# Patient Record
Sex: Female | Born: 1950 | Race: White | Hispanic: No | Marital: Married
Health system: Southern US, Community
[De-identification: ages and names within clinical notes are randomized; demographics above are authoritative.]

---

## 2006-12-01 ENCOUNTER — Encounter: Admission: RE | Admit: 2006-12-01 | Discharge: 2006-12-01 | Payer: Self-pay | Admitting: Unknown Physician Specialty

## 2007-03-29 ENCOUNTER — Encounter: Admission: RE | Admit: 2007-03-29 | Discharge: 2007-03-29 | Payer: Self-pay | Admitting: Unknown Physician Specialty

## 2007-05-04 ENCOUNTER — Encounter: Admission: RE | Admit: 2007-05-04 | Discharge: 2007-05-04 | Payer: Self-pay | Admitting: Interventional Radiology

## 2007-05-11 ENCOUNTER — Encounter: Admission: RE | Admit: 2007-05-11 | Discharge: 2007-05-11 | Payer: Self-pay | Admitting: Interventional Radiology

## 2007-05-30 ENCOUNTER — Encounter: Admission: RE | Admit: 2007-05-30 | Discharge: 2007-05-30 | Payer: Self-pay | Admitting: Interventional Radiology

## 2007-06-06 ENCOUNTER — Encounter: Admission: RE | Admit: 2007-06-06 | Discharge: 2007-06-06 | Payer: Self-pay | Admitting: Interventional Radiology

## 2007-06-28 ENCOUNTER — Encounter: Admission: RE | Admit: 2007-06-28 | Discharge: 2007-06-28 | Payer: Self-pay | Admitting: Interventional Radiology

## 2007-07-19 ENCOUNTER — Encounter: Admission: RE | Admit: 2007-07-19 | Discharge: 2007-07-19 | Payer: Self-pay | Admitting: Interventional Radiology

## 2007-11-14 ENCOUNTER — Encounter: Admission: RE | Admit: 2007-11-14 | Discharge: 2007-11-14 | Payer: Self-pay | Admitting: Interventional Radiology

## 2008-08-16 IMAGING — US EM EST PATIENT OFFICE LEVEL 3 (15 MIN)
1 series · 14 of 16 positions shown · non-contrast
Comparison: none

[Series 1: em est patient office level 3 (15 min) · 14 of 71 slices shown]
[im 1/71]
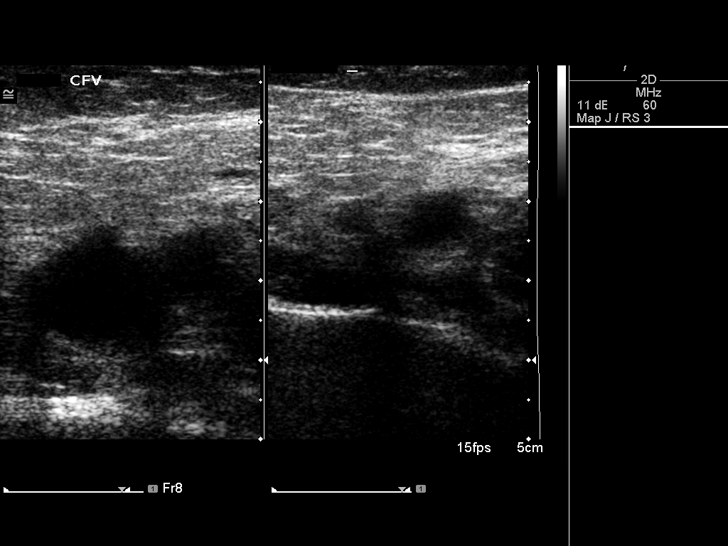
[im 5/71]
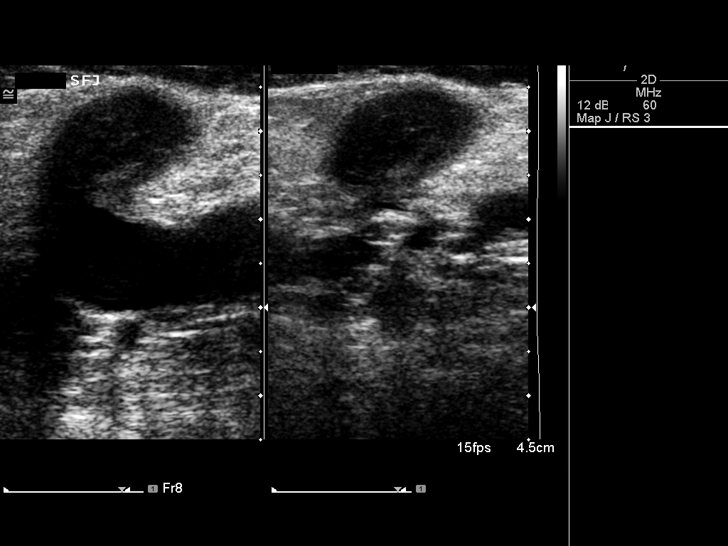
[im 10/71]
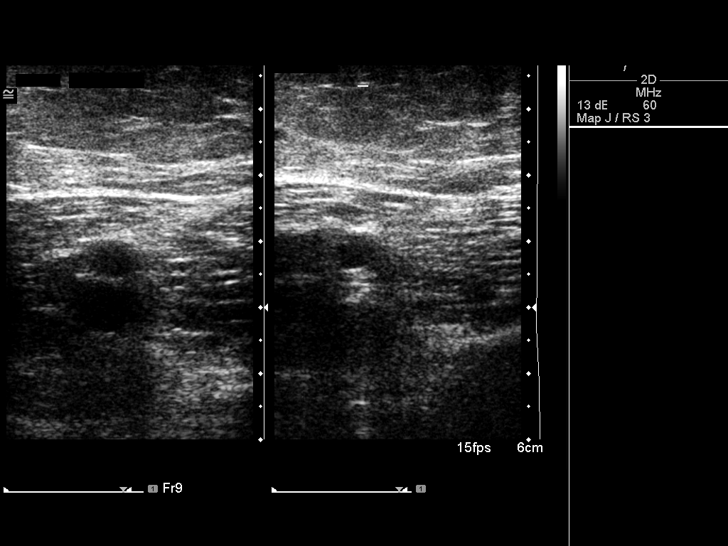
[im 19/71]
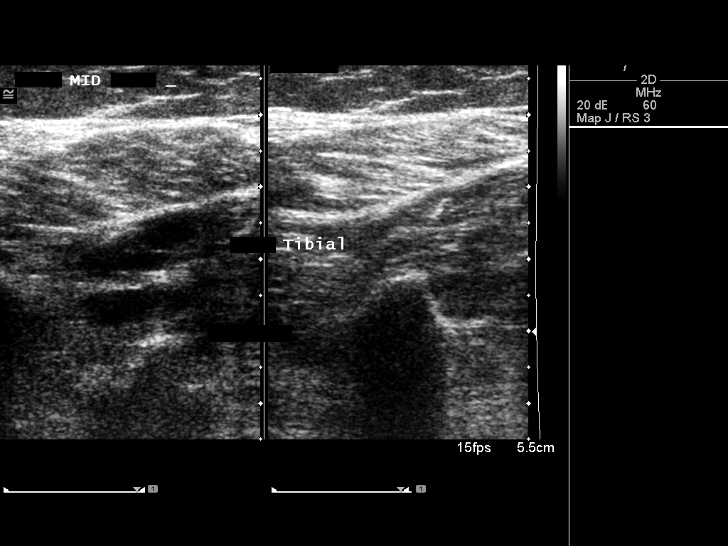
[im 24/71]
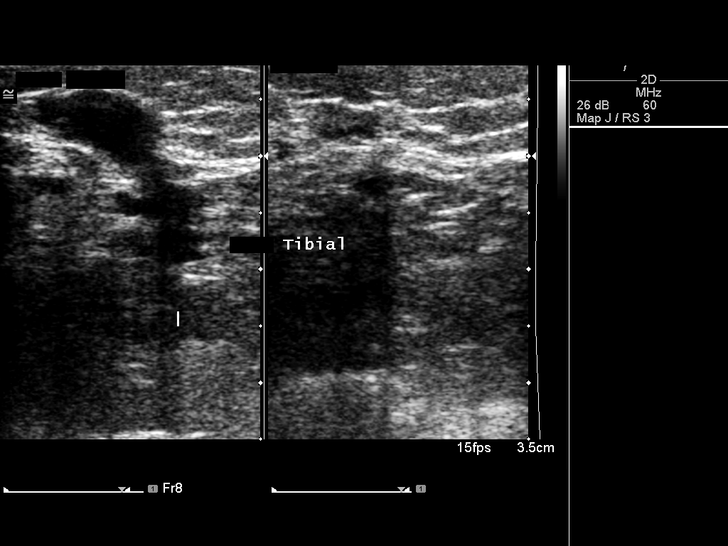
[im 29/71]
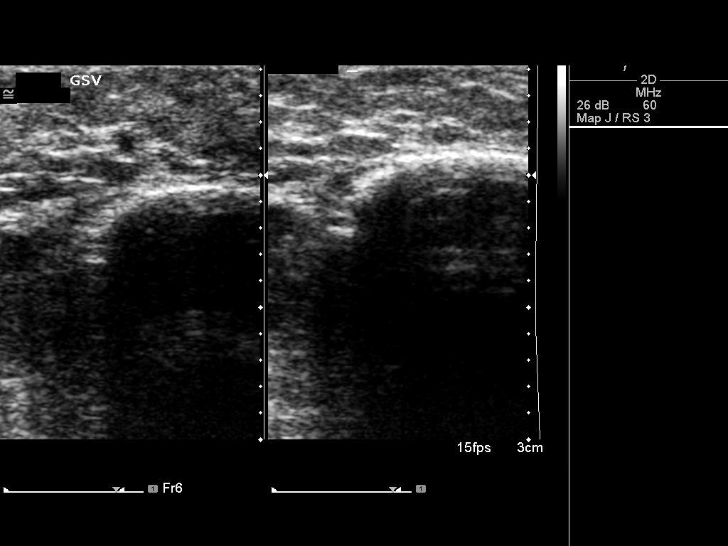
[im 33/71]
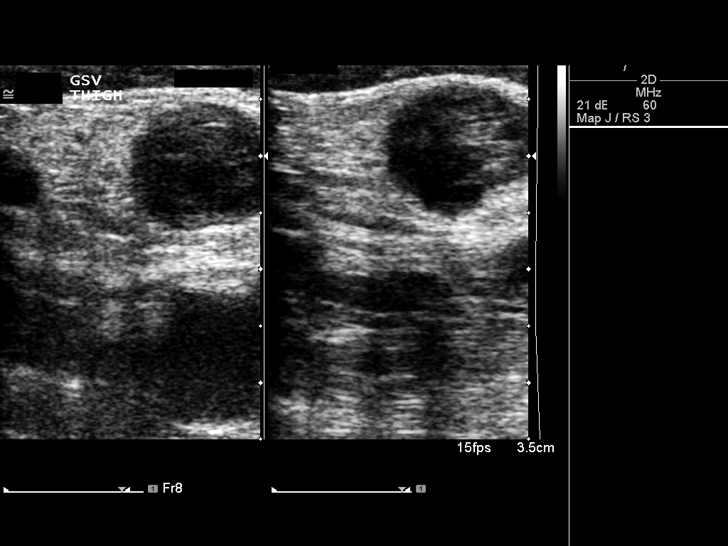
[im 38/71]
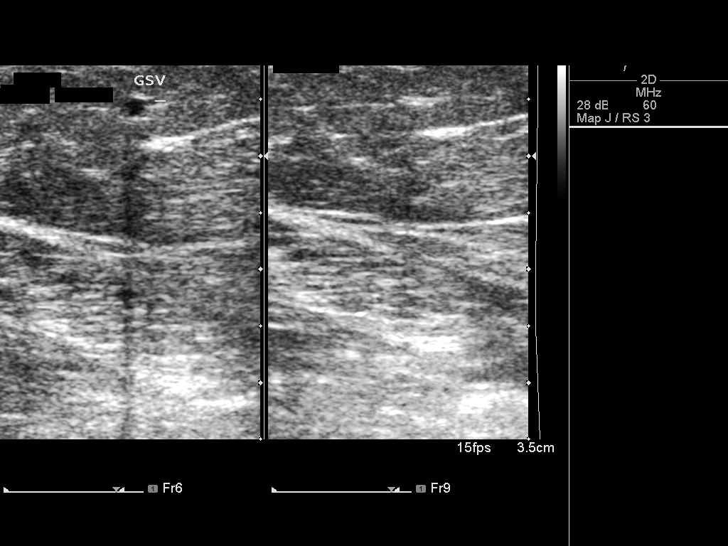
[im 43/71]
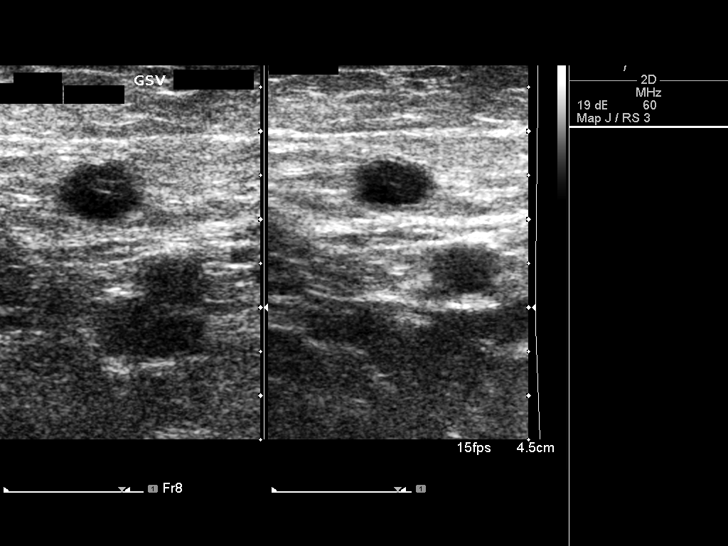
[im 47/71]
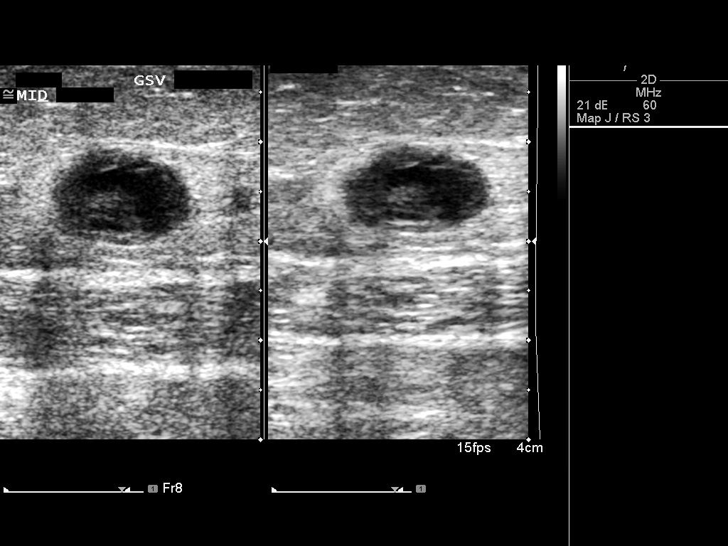
[im 57/71]
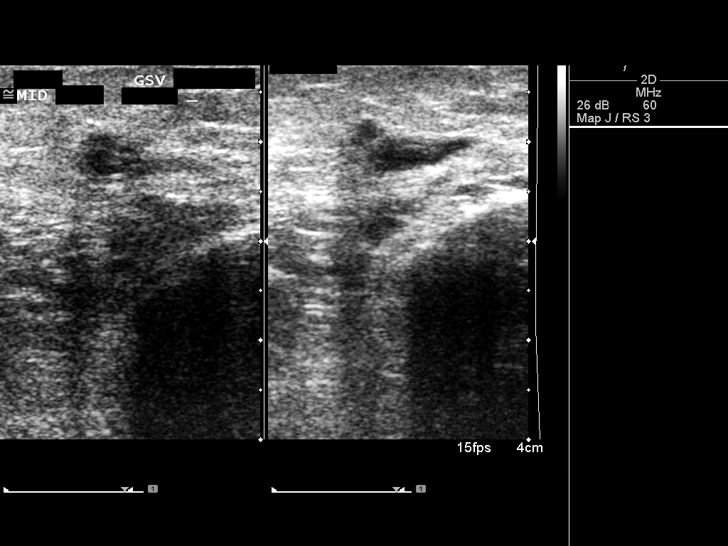
[im 61/71]
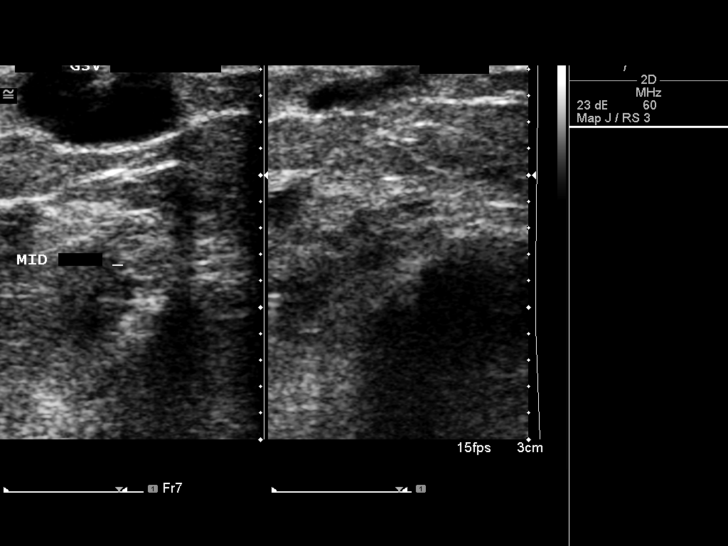
[im 66/71]
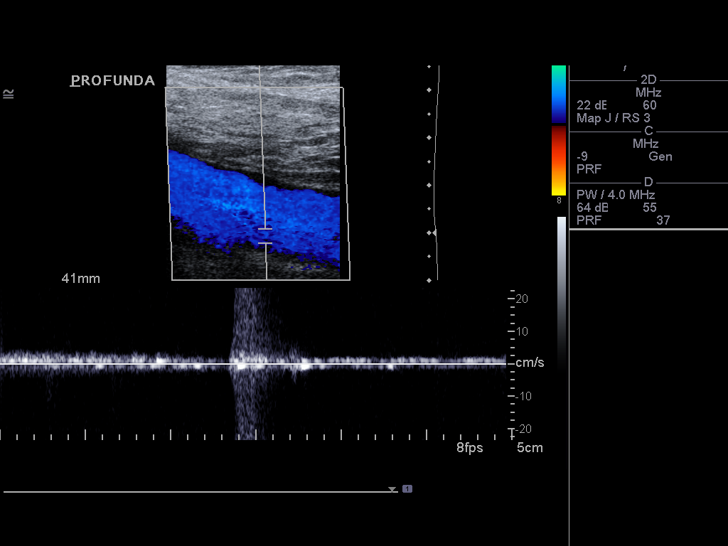
[im 71/71]
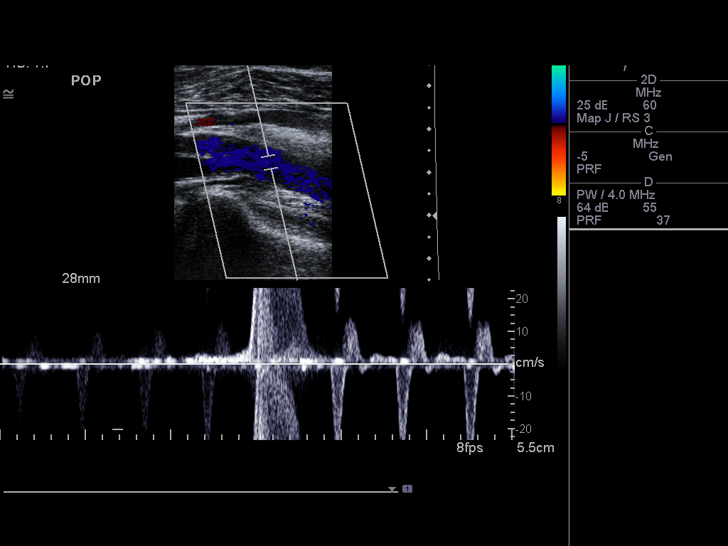

[14 of 16 positions shown; findings below may reference images not displayed]

This 56-year-old female returns for her scheduled   1 week
appointment after transcatheter laser occlusion of the left greater
saphenous vein due to valvular incompetence and reflux resulting in
symptomatic varicose veins.

The patient describes the expected amount of tenderness along the
treatment  course. She has tolerated the  graduated compression
stockings and exercise regimen.

On exam, the expected amount of bruising   along the treatment
course. Expected tenderness. No erythema, swelling, or fluctuance.
Skin entry site is nearly completely healed.

Venous Doppler ultrasound demonstrates complete occlusion of the
treatment segment of the GSV. The deep venous system remains
normal.

My impression is that she is doing well one  week status post
transcatheter occlusion of the left greater saphenous vein. I
encouraged continued use of the graduated compression stockings
during waking  hours. I encouraged continuation of the  exercise
regimen. We will see at her back at the 1 month] followup. The
patient knows to call should there be any interval questions or
problems.

## 2008-09-07 IMAGING — US US EXTREM LOW VENOUS*L*
1 series · 14 of 24 positions shown · non-contrast
Comparison: none

CLINICAL DATA: Symptomatic left leg varicose veins, now 1 month
status post transcatheter laser occlusion of the   greater
saphenous vein.

LEFT LOWER EXTREMITY VENOUS DOPPLER ULTRASOUND
TECHNIQUE: Gray-scale sonography with compression as well as color
and duplex Doppler ultrasound were performed to evaluate   the
saphenous vein and the deep venous system from the level of the
common femoral vein through the popliteal and proximal calf veins.

[Series 1: us extrem low venous*left* · 34 acquisitions, 14 frames shown]
[im 1/34]
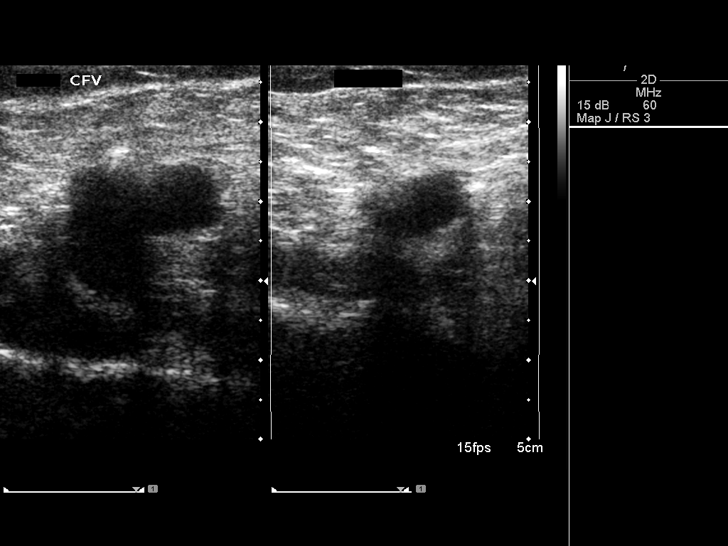
[im 3/34]
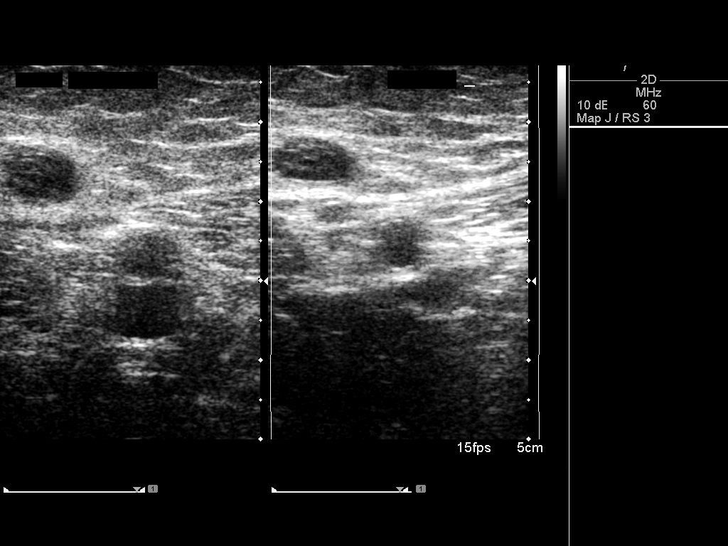
[im 8/34]
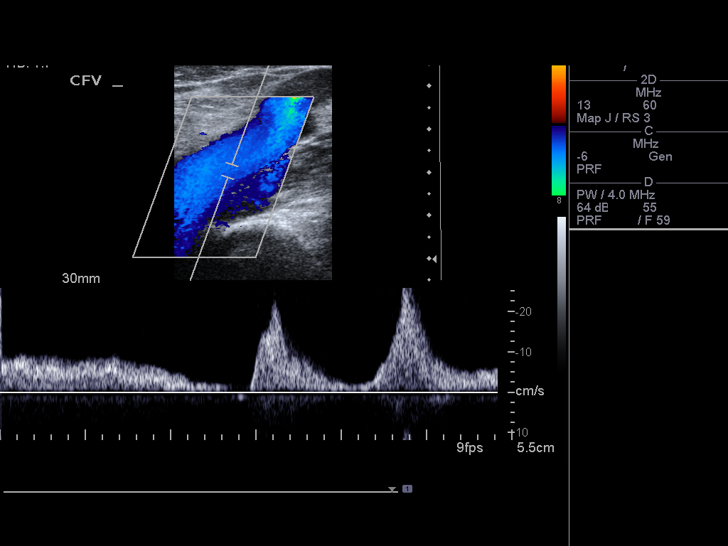
[im 11/34]
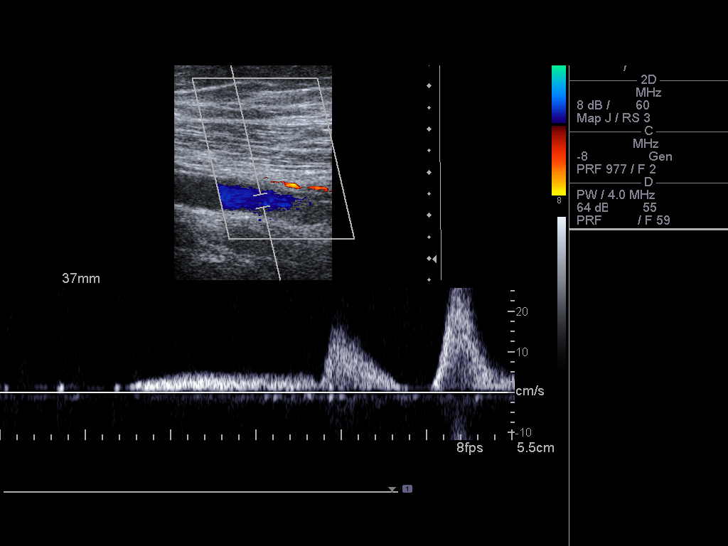
[im 13/34]
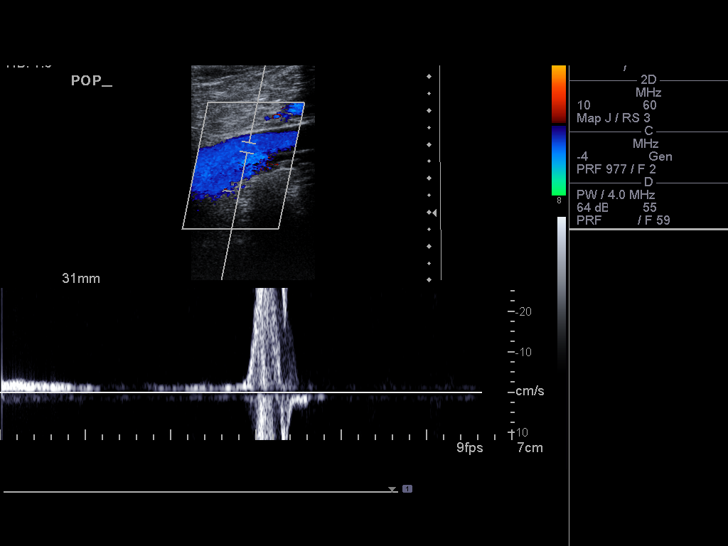
[im 16/34]
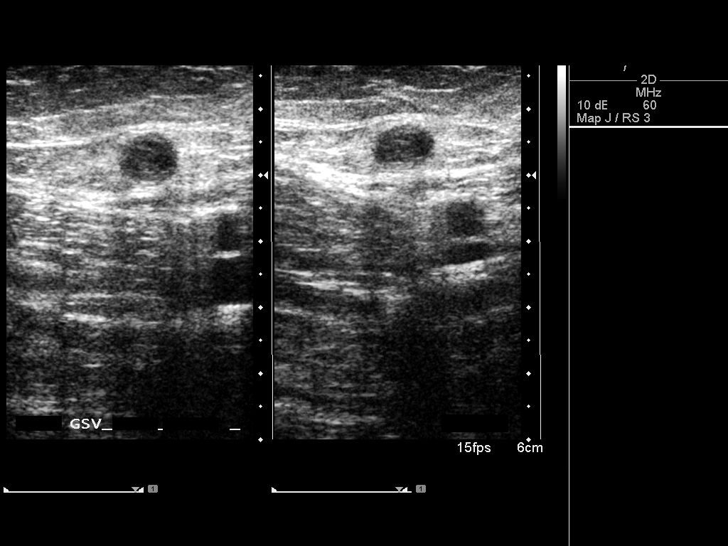
[im 19/34]
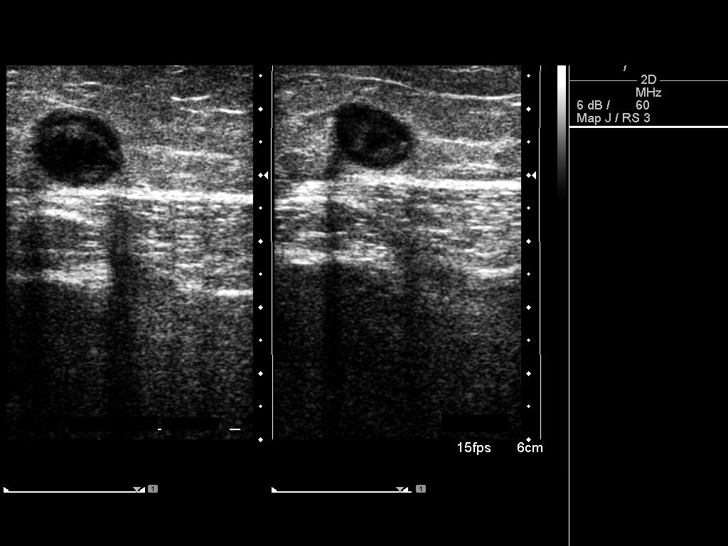
[im 22/34]
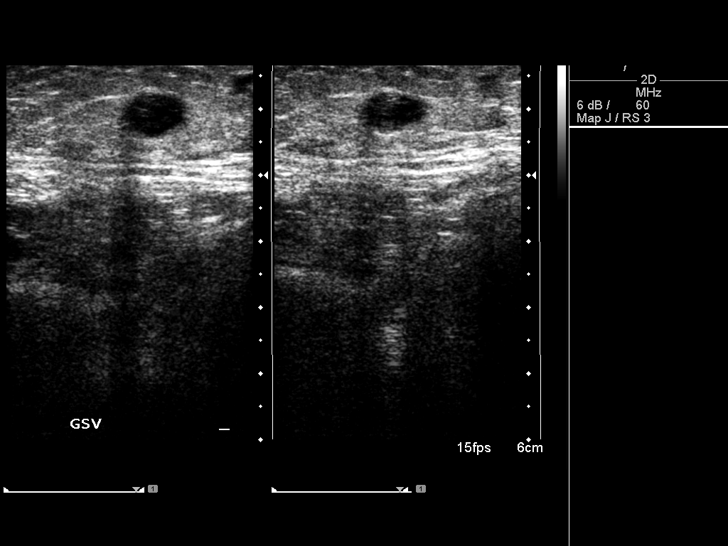
[im 25/34]
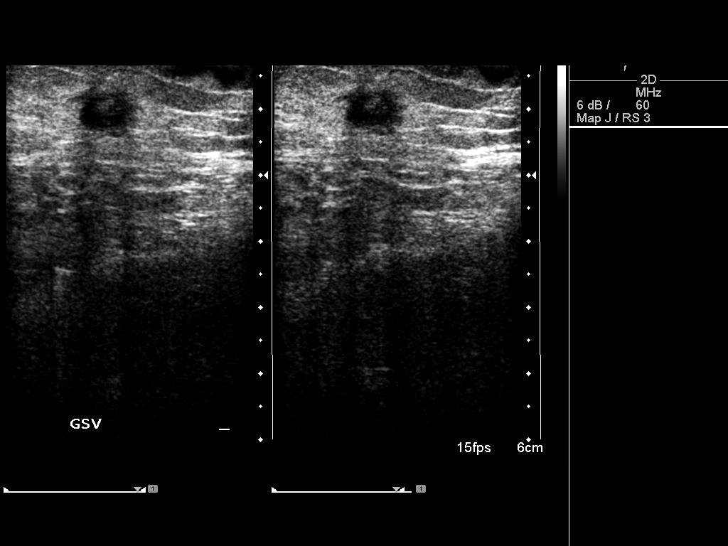
[im 28/34]
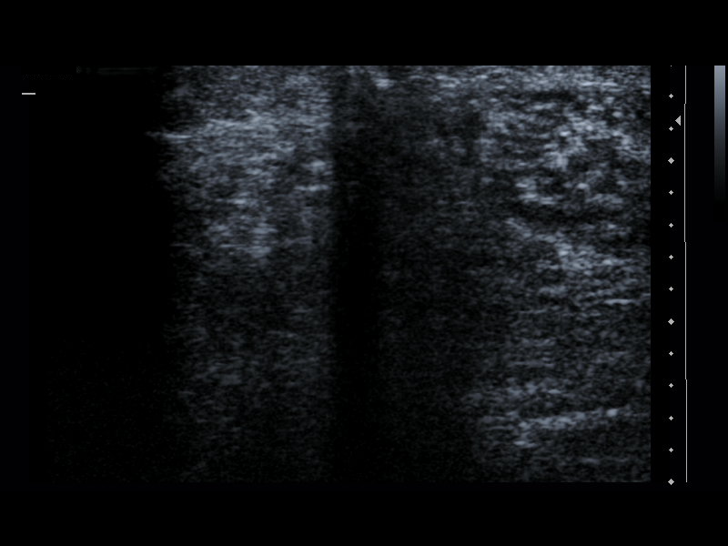
[im 29/34]
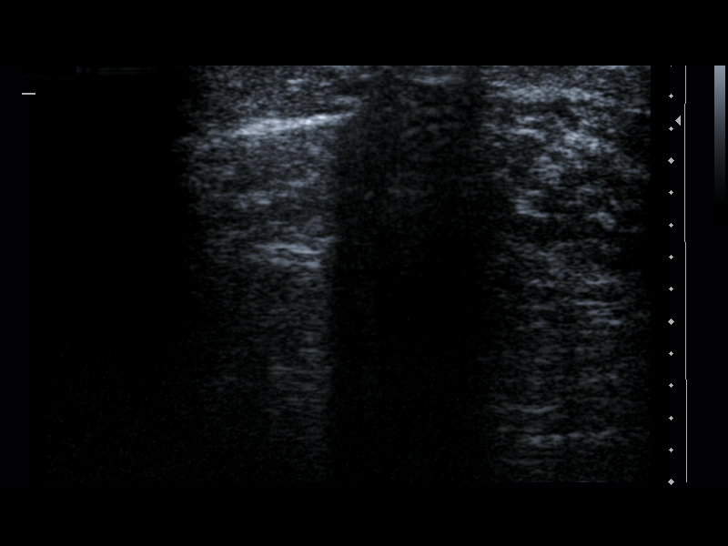
[im 31/34]
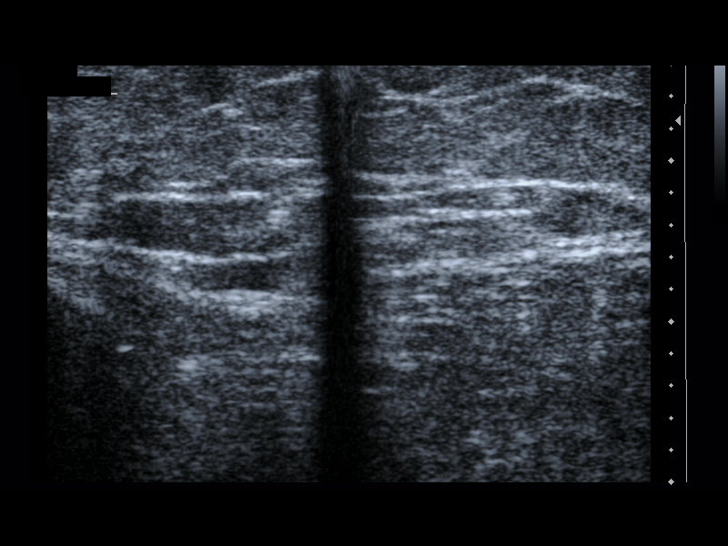
[im 32/34]
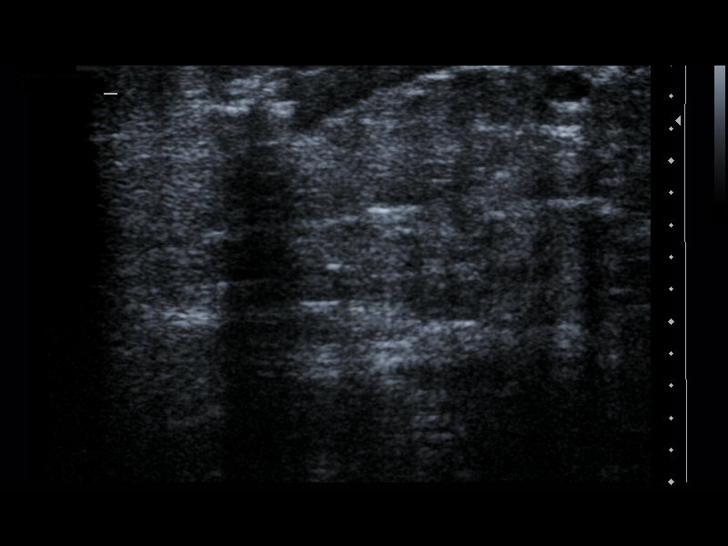
[im 34/34]
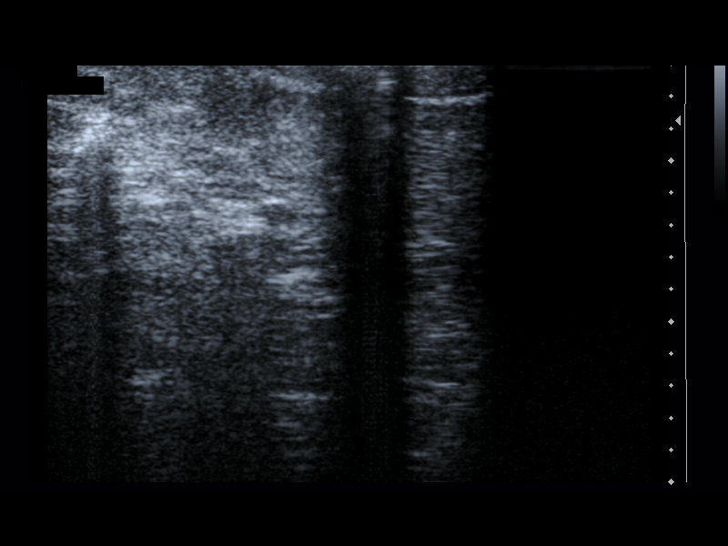

[14 of 24 positions shown; findings below may reference images not displayed]

FINDINGS: The lower extremity deep venous system demonstrates
normal compressibility, phasicity, and augmentation. Posterior
tibial vein unremarkable. No evidence of DVT.

The greater saphenous vein is occluded throughout the treatment
segment. Associated varicose veins in the thigh and calf region are
partially thrombosed and decompressed.

IMPRESSION
1. Technically successful occlusion of the greater saphenous vein
along the treatment length without apparent complication.
2. Normal deep venous system. No DVT.

## 2009-01-24 IMAGING — US US MFM FOLLOW-UP FOCUS VISIT
1 series · 14 of 28 positions shown · non-contrast
Comparison: none

[Series 1: us mfm follow-up focus visit · 14 of 60 slices shown]
[im 3/60]
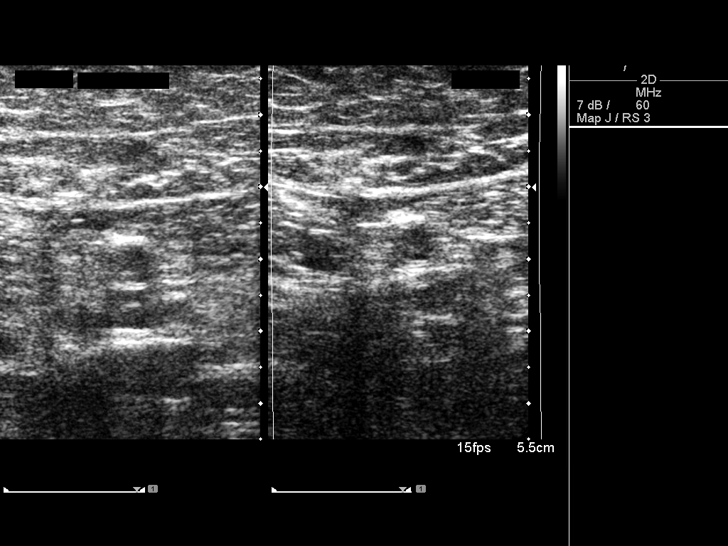
[im 7/60]
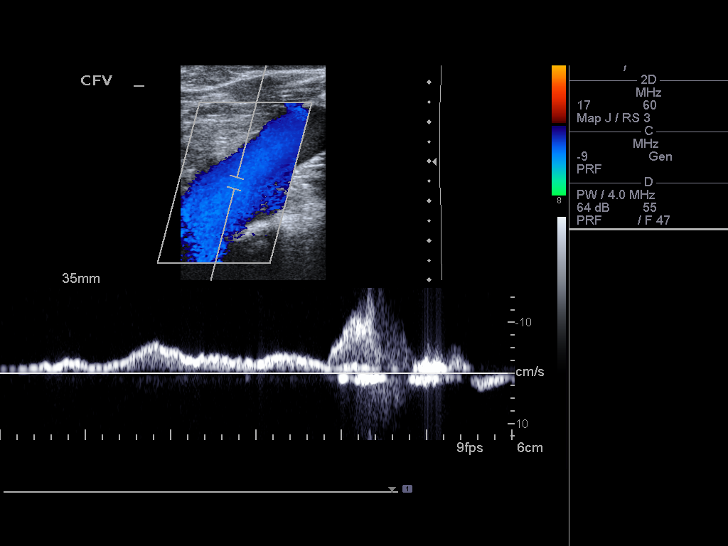
[im 11/60]
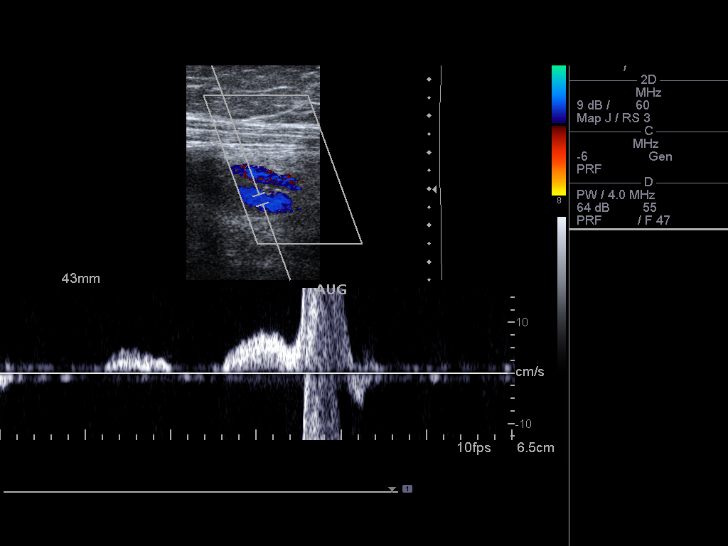
[im 16/60]
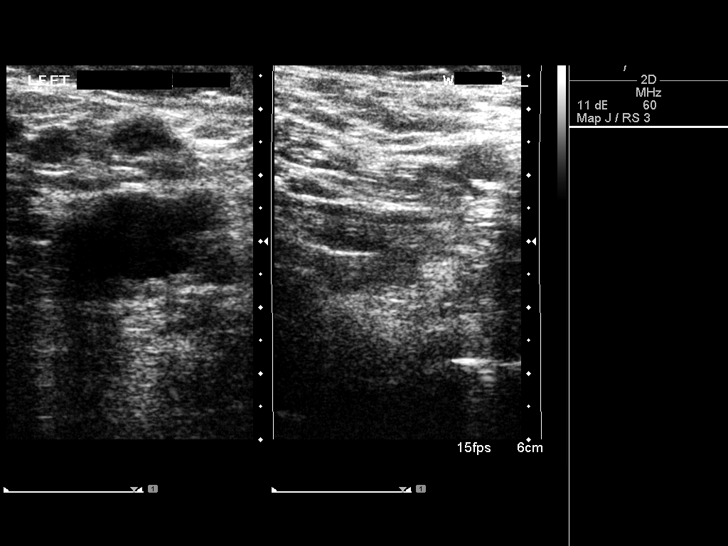
[im 20/60]
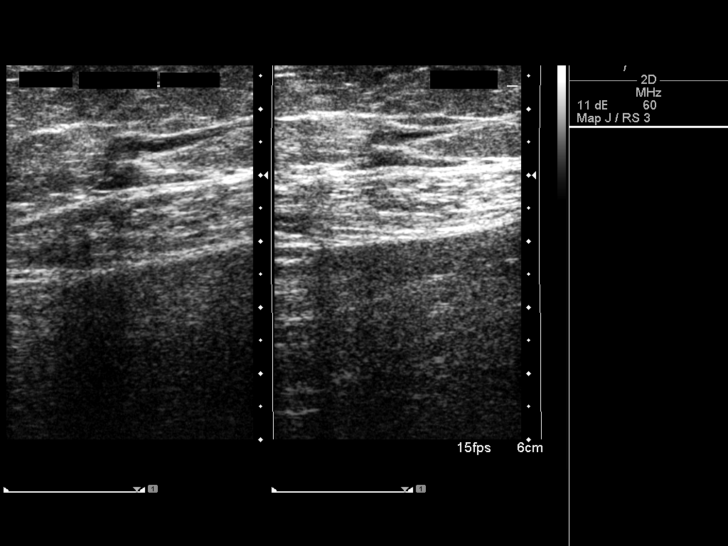
[im 25/60]
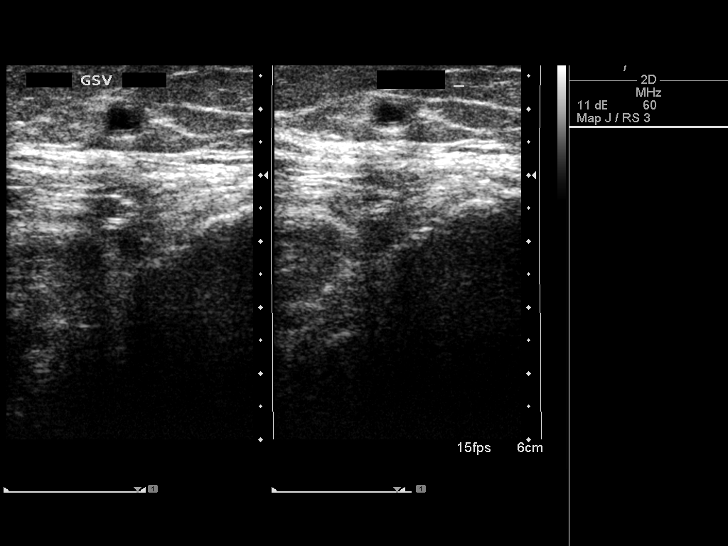
[im 29/60]
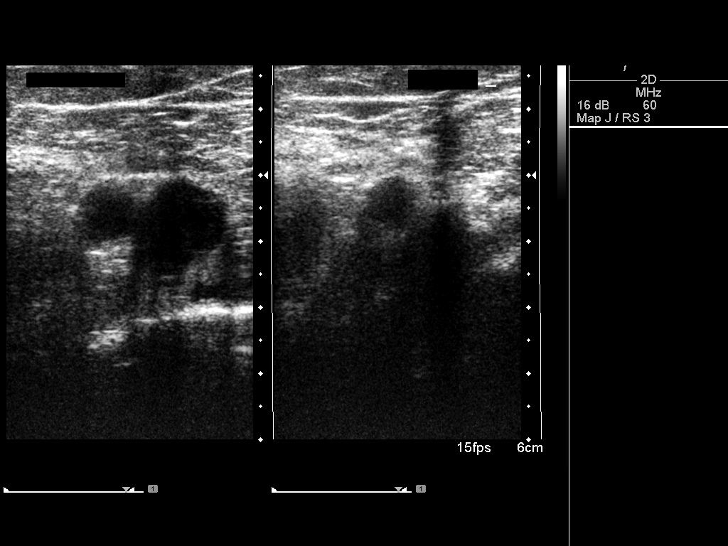
[im 33/60]
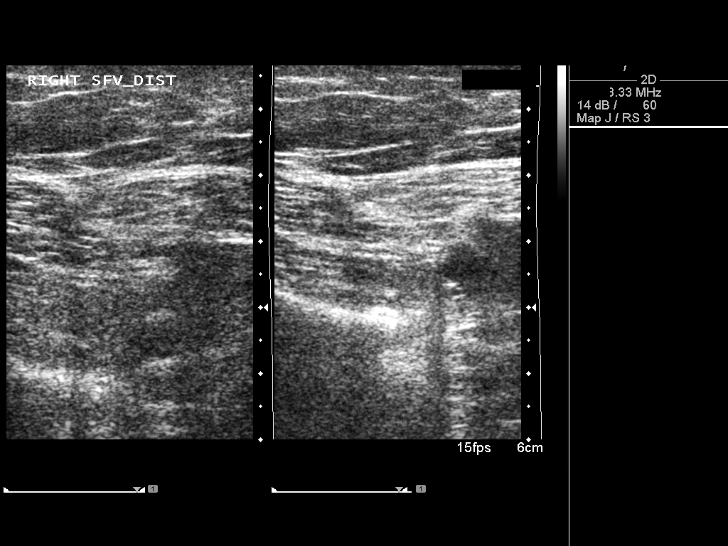
[im 38/60]
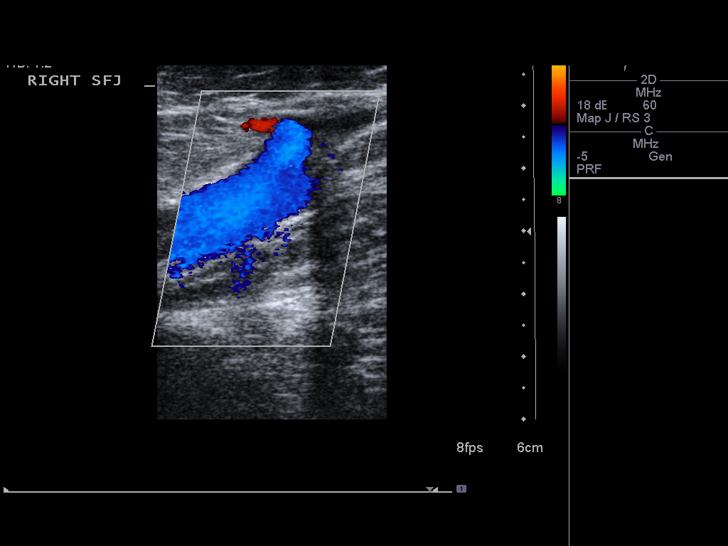
[im 42/60]
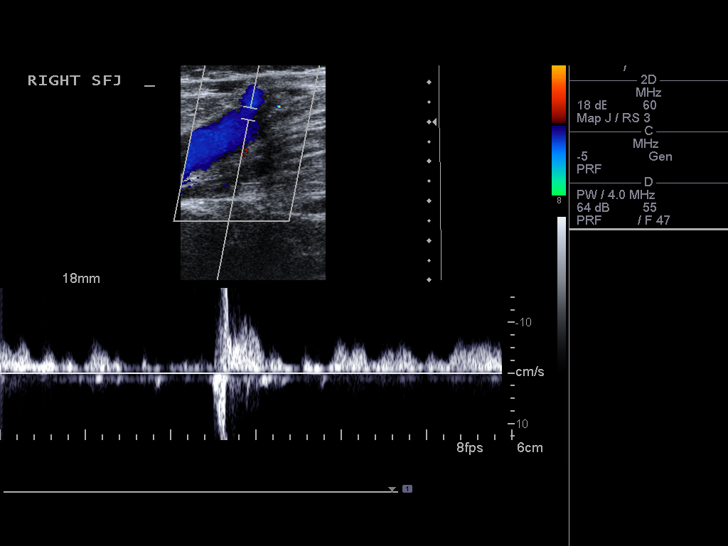
[im 46/60]
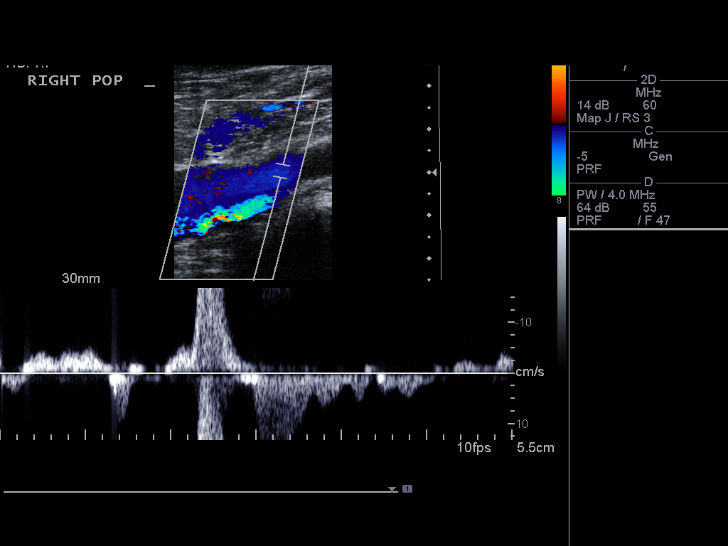
[im 51/60]
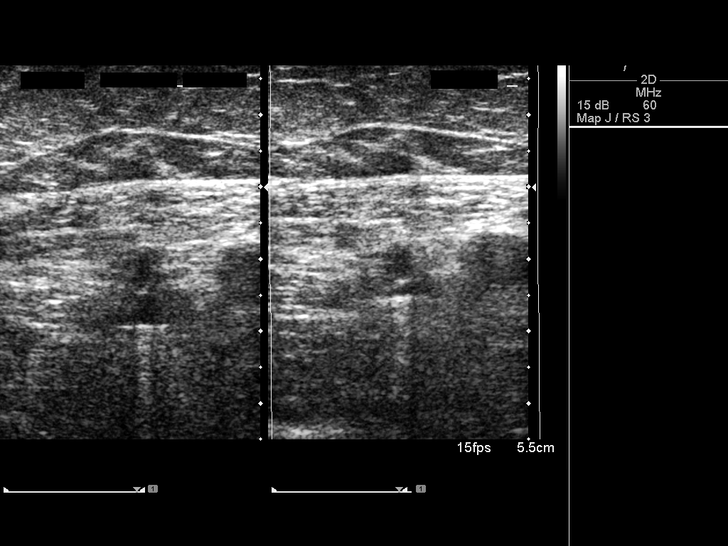
[im 55/60]
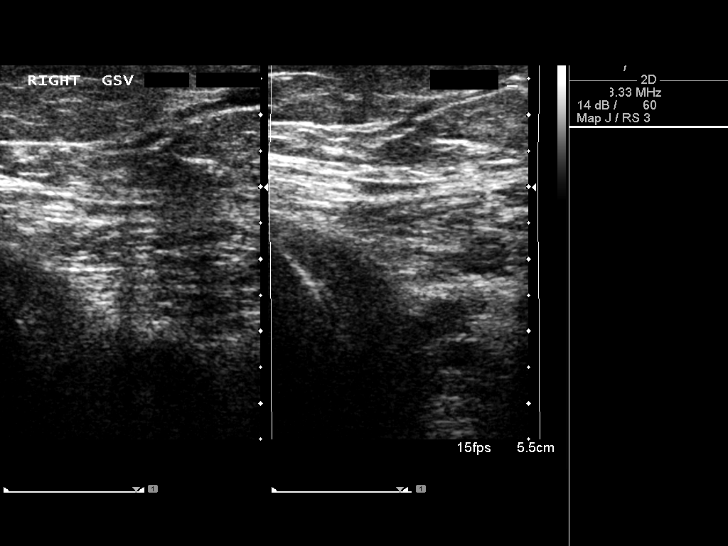
[im 60/60]
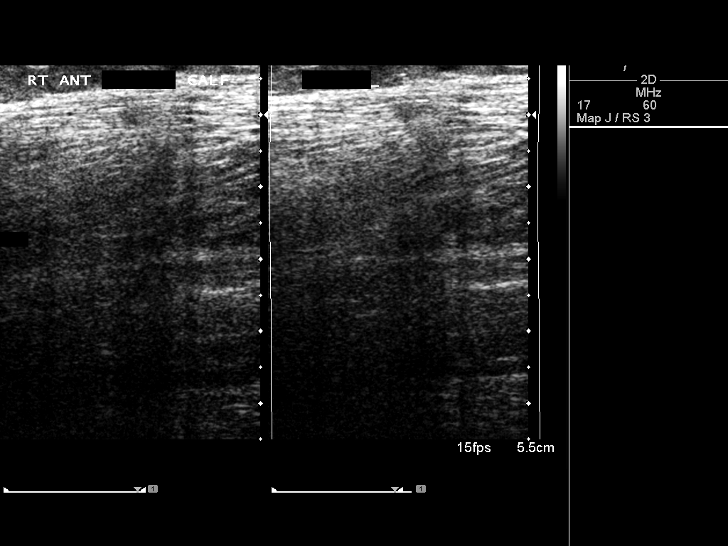

[14 of 28 positions shown; findings below may reference images not displayed]

Established patient office visit [id number]

This 56-year-old female returns for her scheduled 6-month
appointment after transcatheter laser occlusion of the bilateral
greater saphenous veins   due to valvular incompetence and reflux
resulting in symptomatic varicose veins.

She is doing very well.  She describes resolution of lower
extremity symptoms.  She is using the compression hose infrequently
during the warmer months.

On exam, no tenderness. No erythema, swelling, or fluctuance. No
prominent or distended varicose veins persist.  Skin entry site is
healed.

Venous Doppler ultrasound demonstrates   occlusion of the treatment
segments of the GSV bilaterally. The deep venous systems remain
normal.

My impression is that the patient is doing well 6 months status
post transcatheter occlusion of the bilateral greater saphenous
vein. I encouraged  continued use of the graduated compression
stockings during waking  hours. I encouraged continuation of the
exercise regimen. We can see her back on an as-needed basis. The
patient knows to call should there be any questions or problems.

## 2010-04-17 ENCOUNTER — Other Ambulatory Visit: Payer: Self-pay | Admitting: Obstetrics and Gynecology

## 2010-04-17 DIAGNOSIS — Z780 Asymptomatic menopausal state: Secondary | ICD-10-CM

## 2014-05-28 ENCOUNTER — Ambulatory Visit (HOSPITAL_COMMUNITY)
Admission: RE | Admit: 2014-05-28 | Discharge: 2014-05-28 | Disposition: A | Discharge status: Home / Self Care | Payer: Medicare Other | Source: Ambulatory Visit | Attending: Nurse Practitioner | Admitting: Nurse Practitioner

## 2014-05-28 ENCOUNTER — Other Ambulatory Visit (HOSPITAL_COMMUNITY): Payer: Self-pay | Admitting: Nurse Practitioner

## 2014-05-28 DIAGNOSIS — M79605 Pain in left leg: Secondary | ICD-10-CM | POA: Insufficient documentation

## 2014-05-28 DIAGNOSIS — I82812 Embolism and thrombosis of superficial veins of left lower extremities: Secondary | ICD-10-CM | POA: Insufficient documentation

## 2014-05-28 DIAGNOSIS — M79662 Pain in left lower leg: Secondary | ICD-10-CM

## 2014-05-28 DIAGNOSIS — R609 Edema, unspecified: Secondary | ICD-10-CM

## 2014-05-28 DIAGNOSIS — M7989 Other specified soft tissue disorders: Secondary | ICD-10-CM

## 2014-05-29 ENCOUNTER — Other Ambulatory Visit (HOSPITAL_COMMUNITY): Payer: Self-pay | Admitting: Internal Medicine

## 2014-05-29 DIAGNOSIS — R600 Localized edema: Secondary | ICD-10-CM

## 2014-05-29 DIAGNOSIS — R609 Edema, unspecified: Secondary | ICD-10-CM

## 2014-05-29 DIAGNOSIS — Z09 Encounter for follow-up examination after completed treatment for conditions other than malignant neoplasm: Secondary | ICD-10-CM

## 2014-05-29 DIAGNOSIS — M79605 Pain in left leg: Secondary | ICD-10-CM

## 2014-05-29 DIAGNOSIS — I809 Phlebitis and thrombophlebitis of unspecified site: Secondary | ICD-10-CM

## 2014-05-31 ENCOUNTER — Ambulatory Visit (HOSPITAL_COMMUNITY)
Admission: RE | Admit: 2014-05-31 | Discharge: 2014-05-31 | Disposition: A | Discharge status: Home / Self Care | Payer: Medicare Other | Source: Ambulatory Visit | Attending: Internal Medicine | Admitting: Internal Medicine

## 2014-05-31 DIAGNOSIS — R600 Localized edema: Secondary | ICD-10-CM

## 2014-05-31 DIAGNOSIS — Z09 Encounter for follow-up examination after completed treatment for conditions other than malignant neoplasm: Secondary | ICD-10-CM

## 2014-05-31 DIAGNOSIS — I809 Phlebitis and thrombophlebitis of unspecified site: Secondary | ICD-10-CM

## 2014-05-31 DIAGNOSIS — I82812 Embolism and thrombosis of superficial veins of left lower extremities: Secondary | ICD-10-CM | POA: Insufficient documentation

## 2014-05-31 DIAGNOSIS — R609 Edema, unspecified: Secondary | ICD-10-CM

## 2014-05-31 DIAGNOSIS — M79605 Pain in left leg: Secondary | ICD-10-CM

## 2014-06-20 ENCOUNTER — Ambulatory Visit (INDEPENDENT_AMBULATORY_CARE_PROVIDER_SITE_OTHER): Payer: Medicare Other | Admitting: Otolaryngology

## 2014-06-20 DIAGNOSIS — J343 Hypertrophy of nasal turbinates: Secondary | ICD-10-CM | POA: Diagnosis not present

## 2014-06-20 DIAGNOSIS — J310 Chronic rhinitis: Secondary | ICD-10-CM | POA: Diagnosis not present

## 2014-06-20 DIAGNOSIS — J342 Deviated nasal septum: Secondary | ICD-10-CM

## 2014-07-18 ENCOUNTER — Ambulatory Visit (INDEPENDENT_AMBULATORY_CARE_PROVIDER_SITE_OTHER): Payer: Medicare Other | Admitting: Otolaryngology

## 2014-07-18 DIAGNOSIS — J310 Chronic rhinitis: Secondary | ICD-10-CM | POA: Diagnosis not present

## 2014-08-22 ENCOUNTER — Ambulatory Visit (INDEPENDENT_AMBULATORY_CARE_PROVIDER_SITE_OTHER): Payer: Medicare Other | Admitting: Otolaryngology

## 2014-08-22 DIAGNOSIS — J310 Chronic rhinitis: Secondary | ICD-10-CM | POA: Diagnosis not present

## 2015-05-30 ENCOUNTER — Other Ambulatory Visit: Payer: Self-pay | Admitting: Neurosurgery

## 2015-05-30 DIAGNOSIS — M47816 Spondylosis without myelopathy or radiculopathy, lumbar region: Secondary | ICD-10-CM

## 2015-06-03 ENCOUNTER — Ambulatory Visit
Admission: RE | Admit: 2015-06-03 | Discharge: 2015-06-03 | Disposition: A | Discharge status: Home / Self Care | Payer: Medicare Other | Source: Ambulatory Visit | Attending: Neurosurgery | Admitting: Neurosurgery

## 2015-06-03 DIAGNOSIS — M47816 Spondylosis without myelopathy or radiculopathy, lumbar region: Secondary | ICD-10-CM

## 2015-06-03 MED ORDER — IOHEXOL 180 MG/ML  SOLN
1.0000 mL | Freq: Once | INTRAMUSCULAR | Status: AC | PRN
  Administered 2015-06-03: 1 mL via EPIDURAL

## 2015-06-03 MED ORDER — METHYLPREDNISOLONE ACETATE 40 MG/ML INJ SUSP (RADIOLOG
120.0000 mg | Freq: Once | INTRAMUSCULAR | Status: AC
  Administered 2015-06-03: 120 mg via EPIDURAL

## 2015-06-03 NOTE — Discharge Instructions (Signed)

## 2015-07-17 ENCOUNTER — Other Ambulatory Visit: Payer: Self-pay | Admitting: Neurosurgery

## 2015-07-17 DIAGNOSIS — M47817 Spondylosis without myelopathy or radiculopathy, lumbosacral region: Secondary | ICD-10-CM

## 2015-07-23 ENCOUNTER — Ambulatory Visit
Admission: RE | Admit: 2015-07-23 | Discharge: 2015-07-23 | Disposition: A | Discharge status: Home / Self Care | Payer: Medicare Other | Source: Ambulatory Visit | Attending: Neurosurgery | Admitting: Neurosurgery

## 2015-07-23 DIAGNOSIS — M47817 Spondylosis without myelopathy or radiculopathy, lumbosacral region: Secondary | ICD-10-CM

## 2015-07-23 MED ORDER — IOPAMIDOL (ISOVUE-M 200) INJECTION 41%
1.0000 mL | Freq: Once | INTRAMUSCULAR | Status: AC
  Administered 2015-07-23: 1 mL via EPIDURAL

## 2015-07-23 MED ORDER — METHYLPREDNISOLONE ACETATE 40 MG/ML INJ SUSP (RADIOLOG
120.0000 mg | Freq: Once | INTRAMUSCULAR | Status: AC
  Administered 2015-07-23: 120 mg via EPIDURAL

## 2016-05-18 ENCOUNTER — Other Ambulatory Visit: Payer: Self-pay | Admitting: Nurse Practitioner

## 2016-05-18 DIAGNOSIS — M47817 Spondylosis without myelopathy or radiculopathy, lumbosacral region: Secondary | ICD-10-CM

## 2016-05-18 DIAGNOSIS — M5416 Radiculopathy, lumbar region: Secondary | ICD-10-CM

## 2016-05-20 ENCOUNTER — Ambulatory Visit
Admission: RE | Admit: 2016-05-20 | Discharge: 2016-05-20 | Disposition: A | Discharge status: Home / Self Care | Payer: Medicare Other | Source: Ambulatory Visit | Attending: Nurse Practitioner | Admitting: Nurse Practitioner

## 2016-05-20 DIAGNOSIS — M5416 Radiculopathy, lumbar region: Secondary | ICD-10-CM

## 2016-05-20 DIAGNOSIS — M47817 Spondylosis without myelopathy or radiculopathy, lumbosacral region: Secondary | ICD-10-CM

## 2016-05-20 MED ORDER — METHYLPREDNISOLONE ACETATE 40 MG/ML INJ SUSP (RADIOLOG
120.0000 mg | Freq: Once | INTRAMUSCULAR | Status: AC
  Administered 2016-05-20: 120 mg via EPIDURAL

## 2016-05-20 MED ORDER — IOPAMIDOL (ISOVUE-M 200) INJECTION 41%
1.0000 mL | Freq: Once | INTRAMUSCULAR | Status: AC
  Administered 2016-05-20: 1 mL via EPIDURAL

## 2016-05-20 NOTE — Discharge Instructions (Signed)

## 2019-05-30 DIAGNOSIS — G43019 Migraine without aura, intractable, without status migrainosus: Secondary | ICD-10-CM | POA: Diagnosis not present

## 2019-05-30 DIAGNOSIS — G518 Other disorders of facial nerve: Secondary | ICD-10-CM | POA: Diagnosis not present

## 2019-05-30 DIAGNOSIS — G43719 Chronic migraine without aura, intractable, without status migrainosus: Secondary | ICD-10-CM | POA: Diagnosis not present

## 2019-05-30 DIAGNOSIS — M791 Myalgia, unspecified site: Secondary | ICD-10-CM | POA: Diagnosis not present

## 2019-05-30 DIAGNOSIS — M542 Cervicalgia: Secondary | ICD-10-CM | POA: Diagnosis not present

## 2019-06-20 DIAGNOSIS — E1165 Type 2 diabetes mellitus with hyperglycemia: Secondary | ICD-10-CM | POA: Diagnosis not present

## 2019-06-20 DIAGNOSIS — Z299 Encounter for prophylactic measures, unspecified: Secondary | ICD-10-CM | POA: Diagnosis not present

## 2019-06-20 DIAGNOSIS — Z789 Other specified health status: Secondary | ICD-10-CM | POA: Diagnosis not present

## 2019-08-01 DIAGNOSIS — Z299 Encounter for prophylactic measures, unspecified: Secondary | ICD-10-CM | POA: Diagnosis not present

## 2019-08-01 DIAGNOSIS — E1165 Type 2 diabetes mellitus with hyperglycemia: Secondary | ICD-10-CM | POA: Diagnosis not present

## 2019-08-01 DIAGNOSIS — N390 Urinary tract infection, site not specified: Secondary | ICD-10-CM | POA: Diagnosis not present

## 2019-08-09 DIAGNOSIS — E7800 Pure hypercholesterolemia, unspecified: Secondary | ICD-10-CM | POA: Diagnosis not present

## 2019-08-09 DIAGNOSIS — N390 Urinary tract infection, site not specified: Secondary | ICD-10-CM | POA: Diagnosis not present

## 2019-08-09 DIAGNOSIS — Z299 Encounter for prophylactic measures, unspecified: Secondary | ICD-10-CM | POA: Diagnosis not present

## 2019-08-09 DIAGNOSIS — E1165 Type 2 diabetes mellitus with hyperglycemia: Secondary | ICD-10-CM | POA: Diagnosis not present

## 2019-08-27 DIAGNOSIS — M542 Cervicalgia: Secondary | ICD-10-CM | POA: Diagnosis not present

## 2019-08-27 DIAGNOSIS — G43019 Migraine without aura, intractable, without status migrainosus: Secondary | ICD-10-CM | POA: Diagnosis not present

## 2019-08-27 DIAGNOSIS — G518 Other disorders of facial nerve: Secondary | ICD-10-CM | POA: Diagnosis not present

## 2019-08-27 DIAGNOSIS — M791 Myalgia, unspecified site: Secondary | ICD-10-CM | POA: Diagnosis not present

## 2019-08-27 DIAGNOSIS — G43719 Chronic migraine without aura, intractable, without status migrainosus: Secondary | ICD-10-CM | POA: Diagnosis not present

## 2019-09-05 DIAGNOSIS — R5383 Other fatigue: Secondary | ICD-10-CM | POA: Diagnosis not present

## 2019-09-05 DIAGNOSIS — E7800 Pure hypercholesterolemia, unspecified: Secondary | ICD-10-CM | POA: Diagnosis not present

## 2019-09-05 DIAGNOSIS — Z79899 Other long term (current) drug therapy: Secondary | ICD-10-CM | POA: Diagnosis not present

## 2019-09-05 DIAGNOSIS — Z1339 Encounter for screening examination for other mental health and behavioral disorders: Secondary | ICD-10-CM | POA: Diagnosis not present

## 2019-09-05 DIAGNOSIS — Z0000 Encounter for general adult medical examination without abnormal findings: Secondary | ICD-10-CM | POA: Diagnosis not present

## 2019-09-05 DIAGNOSIS — Z1331 Encounter for screening for depression: Secondary | ICD-10-CM | POA: Diagnosis not present

## 2019-09-05 DIAGNOSIS — Z299 Encounter for prophylactic measures, unspecified: Secondary | ICD-10-CM | POA: Diagnosis not present

## 2019-09-05 DIAGNOSIS — Z7189 Other specified counseling: Secondary | ICD-10-CM | POA: Diagnosis not present

## 2019-09-05 DIAGNOSIS — Z1211 Encounter for screening for malignant neoplasm of colon: Secondary | ICD-10-CM | POA: Diagnosis not present

## 2019-09-05 DIAGNOSIS — Z6823 Body mass index (BMI) 23.0-23.9, adult: Secondary | ICD-10-CM | POA: Diagnosis not present

## 2019-09-18 DIAGNOSIS — H43811 Vitreous degeneration, right eye: Secondary | ICD-10-CM | POA: Diagnosis not present

## 2019-09-26 DIAGNOSIS — E1165 Type 2 diabetes mellitus with hyperglycemia: Secondary | ICD-10-CM | POA: Diagnosis not present

## 2019-09-26 DIAGNOSIS — F322 Major depressive disorder, single episode, severe without psychotic features: Secondary | ICD-10-CM | POA: Diagnosis not present

## 2019-09-26 DIAGNOSIS — E7800 Pure hypercholesterolemia, unspecified: Secondary | ICD-10-CM | POA: Diagnosis not present

## 2019-09-26 DIAGNOSIS — Z299 Encounter for prophylactic measures, unspecified: Secondary | ICD-10-CM | POA: Diagnosis not present

## 2019-10-05 DIAGNOSIS — E2839 Other primary ovarian failure: Secondary | ICD-10-CM | POA: Diagnosis not present

## 2019-10-05 DIAGNOSIS — Z79899 Other long term (current) drug therapy: Secondary | ICD-10-CM | POA: Diagnosis not present

## 2019-10-23 DIAGNOSIS — Z1231 Encounter for screening mammogram for malignant neoplasm of breast: Secondary | ICD-10-CM | POA: Diagnosis not present

## 2019-11-27 DIAGNOSIS — G518 Other disorders of facial nerve: Secondary | ICD-10-CM | POA: Diagnosis not present

## 2019-11-27 DIAGNOSIS — M542 Cervicalgia: Secondary | ICD-10-CM | POA: Diagnosis not present

## 2019-11-27 DIAGNOSIS — G43719 Chronic migraine without aura, intractable, without status migrainosus: Secondary | ICD-10-CM | POA: Diagnosis not present

## 2019-11-27 DIAGNOSIS — G43019 Migraine without aura, intractable, without status migrainosus: Secondary | ICD-10-CM | POA: Diagnosis not present

## 2019-11-27 DIAGNOSIS — M791 Myalgia, unspecified site: Secondary | ICD-10-CM | POA: Diagnosis not present

## 2020-01-01 DIAGNOSIS — E1165 Type 2 diabetes mellitus with hyperglycemia: Secondary | ICD-10-CM | POA: Diagnosis not present

## 2020-01-01 DIAGNOSIS — Z23 Encounter for immunization: Secondary | ICD-10-CM | POA: Diagnosis not present

## 2020-01-01 DIAGNOSIS — F322 Major depressive disorder, single episode, severe without psychotic features: Secondary | ICD-10-CM | POA: Diagnosis not present

## 2020-01-01 DIAGNOSIS — Z299 Encounter for prophylactic measures, unspecified: Secondary | ICD-10-CM | POA: Diagnosis not present

## 2020-01-30 DIAGNOSIS — Z789 Other specified health status: Secondary | ICD-10-CM | POA: Diagnosis not present

## 2020-01-30 DIAGNOSIS — F322 Major depressive disorder, single episode, severe without psychotic features: Secondary | ICD-10-CM | POA: Diagnosis not present

## 2020-01-30 DIAGNOSIS — Z299 Encounter for prophylactic measures, unspecified: Secondary | ICD-10-CM | POA: Diagnosis not present

## 2020-01-30 DIAGNOSIS — E1165 Type 2 diabetes mellitus with hyperglycemia: Secondary | ICD-10-CM | POA: Diagnosis not present

## 2020-01-30 DIAGNOSIS — J329 Chronic sinusitis, unspecified: Secondary | ICD-10-CM | POA: Diagnosis not present

## 2020-03-06 DIAGNOSIS — G518 Other disorders of facial nerve: Secondary | ICD-10-CM | POA: Diagnosis not present

## 2020-03-06 DIAGNOSIS — M542 Cervicalgia: Secondary | ICD-10-CM | POA: Diagnosis not present

## 2020-03-06 DIAGNOSIS — M791 Myalgia, unspecified site: Secondary | ICD-10-CM | POA: Diagnosis not present

## 2020-03-06 DIAGNOSIS — G43019 Migraine without aura, intractable, without status migrainosus: Secondary | ICD-10-CM | POA: Diagnosis not present

## 2020-03-06 DIAGNOSIS — G43719 Chronic migraine without aura, intractable, without status migrainosus: Secondary | ICD-10-CM | POA: Diagnosis not present

## 2020-04-09 DIAGNOSIS — Z789 Other specified health status: Secondary | ICD-10-CM | POA: Diagnosis not present

## 2020-04-09 DIAGNOSIS — E1165 Type 2 diabetes mellitus with hyperglycemia: Secondary | ICD-10-CM | POA: Diagnosis not present

## 2020-04-09 DIAGNOSIS — Z299 Encounter for prophylactic measures, unspecified: Secondary | ICD-10-CM | POA: Diagnosis not present

## 2020-05-20 DIAGNOSIS — E1165 Type 2 diabetes mellitus with hyperglycemia: Secondary | ICD-10-CM | POA: Diagnosis not present

## 2020-05-20 DIAGNOSIS — Z299 Encounter for prophylactic measures, unspecified: Secondary | ICD-10-CM | POA: Diagnosis not present

## 2020-05-20 DIAGNOSIS — F32A Depression, unspecified: Secondary | ICD-10-CM | POA: Diagnosis not present

## 2020-05-20 DIAGNOSIS — E7800 Pure hypercholesterolemia, unspecified: Secondary | ICD-10-CM | POA: Diagnosis not present

## 2020-06-03 DIAGNOSIS — G43019 Migraine without aura, intractable, without status migrainosus: Secondary | ICD-10-CM | POA: Diagnosis not present

## 2020-06-03 DIAGNOSIS — G518 Other disorders of facial nerve: Secondary | ICD-10-CM | POA: Diagnosis not present

## 2020-06-03 DIAGNOSIS — G43719 Chronic migraine without aura, intractable, without status migrainosus: Secondary | ICD-10-CM | POA: Diagnosis not present

## 2020-06-03 DIAGNOSIS — M542 Cervicalgia: Secondary | ICD-10-CM | POA: Diagnosis not present

## 2020-06-03 DIAGNOSIS — M791 Myalgia, unspecified site: Secondary | ICD-10-CM | POA: Diagnosis not present

## 2020-07-22 DIAGNOSIS — E1165 Type 2 diabetes mellitus with hyperglycemia: Secondary | ICD-10-CM | POA: Diagnosis not present

## 2020-07-22 DIAGNOSIS — Z299 Encounter for prophylactic measures, unspecified: Secondary | ICD-10-CM | POA: Diagnosis not present

## 2020-07-22 DIAGNOSIS — Z789 Other specified health status: Secondary | ICD-10-CM | POA: Diagnosis not present

## 2020-08-06 DIAGNOSIS — Z299 Encounter for prophylactic measures, unspecified: Secondary | ICD-10-CM | POA: Diagnosis not present

## 2020-08-06 DIAGNOSIS — F331 Major depressive disorder, recurrent, moderate: Secondary | ICD-10-CM | POA: Diagnosis not present

## 2020-08-06 DIAGNOSIS — E1165 Type 2 diabetes mellitus with hyperglycemia: Secondary | ICD-10-CM | POA: Diagnosis not present

## 2020-08-06 DIAGNOSIS — J069 Acute upper respiratory infection, unspecified: Secondary | ICD-10-CM | POA: Diagnosis not present

## 2020-08-06 DIAGNOSIS — J309 Allergic rhinitis, unspecified: Secondary | ICD-10-CM | POA: Diagnosis not present

## 2020-08-14 DIAGNOSIS — R059 Cough, unspecified: Secondary | ICD-10-CM | POA: Diagnosis not present

## 2020-08-14 DIAGNOSIS — E1165 Type 2 diabetes mellitus with hyperglycemia: Secondary | ICD-10-CM | POA: Diagnosis not present

## 2020-08-14 DIAGNOSIS — J069 Acute upper respiratory infection, unspecified: Secondary | ICD-10-CM | POA: Diagnosis not present

## 2020-08-14 DIAGNOSIS — M419 Scoliosis, unspecified: Secondary | ICD-10-CM | POA: Diagnosis not present

## 2020-08-14 DIAGNOSIS — R0602 Shortness of breath: Secondary | ICD-10-CM | POA: Diagnosis not present

## 2020-08-14 DIAGNOSIS — R0989 Other specified symptoms and signs involving the circulatory and respiratory systems: Secondary | ICD-10-CM | POA: Diagnosis not present

## 2020-08-14 DIAGNOSIS — Z299 Encounter for prophylactic measures, unspecified: Secondary | ICD-10-CM | POA: Diagnosis not present

## 2020-08-26 DIAGNOSIS — J329 Chronic sinusitis, unspecified: Secondary | ICD-10-CM | POA: Diagnosis not present

## 2020-08-26 DIAGNOSIS — E1165 Type 2 diabetes mellitus with hyperglycemia: Secondary | ICD-10-CM | POA: Diagnosis not present

## 2020-08-26 DIAGNOSIS — Z299 Encounter for prophylactic measures, unspecified: Secondary | ICD-10-CM | POA: Diagnosis not present

## 2020-08-26 DIAGNOSIS — F331 Major depressive disorder, recurrent, moderate: Secondary | ICD-10-CM | POA: Diagnosis not present

## 2020-09-09 DIAGNOSIS — G518 Other disorders of facial nerve: Secondary | ICD-10-CM | POA: Diagnosis not present

## 2020-09-09 DIAGNOSIS — G43719 Chronic migraine without aura, intractable, without status migrainosus: Secondary | ICD-10-CM | POA: Diagnosis not present

## 2020-09-09 DIAGNOSIS — M791 Myalgia, unspecified site: Secondary | ICD-10-CM | POA: Diagnosis not present

## 2020-09-09 DIAGNOSIS — M542 Cervicalgia: Secondary | ICD-10-CM | POA: Diagnosis not present

## 2020-09-09 DIAGNOSIS — G43019 Migraine without aura, intractable, without status migrainosus: Secondary | ICD-10-CM | POA: Diagnosis not present

## 2020-09-10 DIAGNOSIS — Z1331 Encounter for screening for depression: Secondary | ICD-10-CM | POA: Diagnosis not present

## 2020-09-10 DIAGNOSIS — Z7189 Other specified counseling: Secondary | ICD-10-CM | POA: Diagnosis not present

## 2020-09-10 DIAGNOSIS — Z0000 Encounter for general adult medical examination without abnormal findings: Secondary | ICD-10-CM | POA: Diagnosis not present

## 2020-09-10 DIAGNOSIS — Z6823 Body mass index (BMI) 23.0-23.9, adult: Secondary | ICD-10-CM | POA: Diagnosis not present

## 2020-09-10 DIAGNOSIS — Z1339 Encounter for screening examination for other mental health and behavioral disorders: Secondary | ICD-10-CM | POA: Diagnosis not present

## 2020-09-10 DIAGNOSIS — Z299 Encounter for prophylactic measures, unspecified: Secondary | ICD-10-CM | POA: Diagnosis not present

## 2020-09-10 DIAGNOSIS — Z79899 Other long term (current) drug therapy: Secondary | ICD-10-CM | POA: Diagnosis not present

## 2020-09-10 DIAGNOSIS — E7800 Pure hypercholesterolemia, unspecified: Secondary | ICD-10-CM | POA: Diagnosis not present

## 2020-09-10 DIAGNOSIS — R5383 Other fatigue: Secondary | ICD-10-CM | POA: Diagnosis not present

## 2020-10-01 DIAGNOSIS — H43811 Vitreous degeneration, right eye: Secondary | ICD-10-CM | POA: Diagnosis not present

## 2020-10-22 DIAGNOSIS — Z299 Encounter for prophylactic measures, unspecified: Secondary | ICD-10-CM | POA: Diagnosis not present

## 2020-10-22 DIAGNOSIS — Z8616 Personal history of COVID-19: Secondary | ICD-10-CM | POA: Diagnosis not present

## 2020-10-22 DIAGNOSIS — W19XXXA Unspecified fall, initial encounter: Secondary | ICD-10-CM | POA: Diagnosis not present

## 2020-10-22 DIAGNOSIS — E1165 Type 2 diabetes mellitus with hyperglycemia: Secondary | ICD-10-CM | POA: Diagnosis not present

## 2020-11-11 DIAGNOSIS — Z299 Encounter for prophylactic measures, unspecified: Secondary | ICD-10-CM | POA: Diagnosis not present

## 2020-11-11 DIAGNOSIS — R350 Frequency of micturition: Secondary | ICD-10-CM | POA: Diagnosis not present

## 2020-11-11 DIAGNOSIS — E1165 Type 2 diabetes mellitus with hyperglycemia: Secondary | ICD-10-CM | POA: Diagnosis not present

## 2020-11-11 DIAGNOSIS — N390 Urinary tract infection, site not specified: Secondary | ICD-10-CM | POA: Diagnosis not present

## 2020-11-21 DIAGNOSIS — N23 Unspecified renal colic: Secondary | ICD-10-CM | POA: Diagnosis not present

## 2020-11-21 DIAGNOSIS — Z299 Encounter for prophylactic measures, unspecified: Secondary | ICD-10-CM | POA: Diagnosis not present

## 2020-11-21 DIAGNOSIS — Z6824 Body mass index (BMI) 24.0-24.9, adult: Secondary | ICD-10-CM | POA: Diagnosis not present

## 2020-11-21 DIAGNOSIS — Z713 Dietary counseling and surveillance: Secondary | ICD-10-CM | POA: Diagnosis not present

## 2020-12-23 DIAGNOSIS — M791 Myalgia, unspecified site: Secondary | ICD-10-CM | POA: Diagnosis not present

## 2020-12-23 DIAGNOSIS — G518 Other disorders of facial nerve: Secondary | ICD-10-CM | POA: Diagnosis not present

## 2020-12-23 DIAGNOSIS — M542 Cervicalgia: Secondary | ICD-10-CM | POA: Diagnosis not present

## 2020-12-23 DIAGNOSIS — G43019 Migraine without aura, intractable, without status migrainosus: Secondary | ICD-10-CM | POA: Diagnosis not present

## 2020-12-23 DIAGNOSIS — G43719 Chronic migraine without aura, intractable, without status migrainosus: Secondary | ICD-10-CM | POA: Diagnosis not present

## 2020-12-26 DIAGNOSIS — Z1231 Encounter for screening mammogram for malignant neoplasm of breast: Secondary | ICD-10-CM | POA: Diagnosis not present

## 2021-01-28 DIAGNOSIS — J309 Allergic rhinitis, unspecified: Secondary | ICD-10-CM | POA: Diagnosis not present

## 2021-01-28 DIAGNOSIS — J320 Chronic maxillary sinusitis: Secondary | ICD-10-CM | POA: Diagnosis not present

## 2021-01-28 DIAGNOSIS — Z299 Encounter for prophylactic measures, unspecified: Secondary | ICD-10-CM | POA: Diagnosis not present

## 2021-01-28 DIAGNOSIS — E1165 Type 2 diabetes mellitus with hyperglycemia: Secondary | ICD-10-CM | POA: Diagnosis not present

## 2021-01-28 DIAGNOSIS — Z6823 Body mass index (BMI) 23.0-23.9, adult: Secondary | ICD-10-CM | POA: Diagnosis not present

## 2021-03-30 DIAGNOSIS — G43019 Migraine without aura, intractable, without status migrainosus: Secondary | ICD-10-CM | POA: Diagnosis not present

## 2021-03-30 DIAGNOSIS — G518 Other disorders of facial nerve: Secondary | ICD-10-CM | POA: Diagnosis not present

## 2021-03-30 DIAGNOSIS — G43719 Chronic migraine without aura, intractable, without status migrainosus: Secondary | ICD-10-CM | POA: Diagnosis not present

## 2021-03-30 DIAGNOSIS — M542 Cervicalgia: Secondary | ICD-10-CM | POA: Diagnosis not present

## 2021-03-30 DIAGNOSIS — M791 Myalgia, unspecified site: Secondary | ICD-10-CM | POA: Diagnosis not present

## 2021-04-21 DIAGNOSIS — M79673 Pain in unspecified foot: Secondary | ICD-10-CM | POA: Diagnosis not present

## 2021-04-21 DIAGNOSIS — M7741 Metatarsalgia, right foot: Secondary | ICD-10-CM | POA: Diagnosis not present

## 2021-04-21 DIAGNOSIS — M7742 Metatarsalgia, left foot: Secondary | ICD-10-CM | POA: Diagnosis not present

## 2021-05-19 DIAGNOSIS — Z789 Other specified health status: Secondary | ICD-10-CM | POA: Diagnosis not present

## 2021-05-19 DIAGNOSIS — Z299 Encounter for prophylactic measures, unspecified: Secondary | ICD-10-CM | POA: Diagnosis not present

## 2021-05-19 DIAGNOSIS — R109 Unspecified abdominal pain: Secondary | ICD-10-CM | POA: Diagnosis not present

## 2021-05-19 DIAGNOSIS — E1165 Type 2 diabetes mellitus with hyperglycemia: Secondary | ICD-10-CM | POA: Diagnosis not present

## 2021-05-25 DIAGNOSIS — N200 Calculus of kidney: Secondary | ICD-10-CM | POA: Diagnosis not present

## 2021-05-25 DIAGNOSIS — N1330 Unspecified hydronephrosis: Secondary | ICD-10-CM | POA: Diagnosis not present

## 2021-05-25 DIAGNOSIS — R1032 Left lower quadrant pain: Secondary | ICD-10-CM | POA: Diagnosis not present

## 2021-05-25 DIAGNOSIS — R9341 Abnormal radiologic findings on diagnostic imaging of renal pelvis, ureter, or bladder: Secondary | ICD-10-CM | POA: Diagnosis not present

## 2021-05-27 DIAGNOSIS — Z789 Other specified health status: Secondary | ICD-10-CM | POA: Diagnosis not present

## 2021-05-27 DIAGNOSIS — N200 Calculus of kidney: Secondary | ICD-10-CM | POA: Diagnosis not present

## 2021-05-27 DIAGNOSIS — Z299 Encounter for prophylactic measures, unspecified: Secondary | ICD-10-CM | POA: Diagnosis not present

## 2021-06-22 DIAGNOSIS — M791 Myalgia, unspecified site: Secondary | ICD-10-CM | POA: Diagnosis not present

## 2021-06-22 DIAGNOSIS — G43719 Chronic migraine without aura, intractable, without status migrainosus: Secondary | ICD-10-CM | POA: Diagnosis not present

## 2021-06-22 DIAGNOSIS — M542 Cervicalgia: Secondary | ICD-10-CM | POA: Diagnosis not present

## 2021-06-22 DIAGNOSIS — G43019 Migraine without aura, intractable, without status migrainosus: Secondary | ICD-10-CM | POA: Diagnosis not present

## 2021-06-22 DIAGNOSIS — G518 Other disorders of facial nerve: Secondary | ICD-10-CM | POA: Diagnosis not present

## 2021-06-24 ENCOUNTER — Ambulatory Visit (INDEPENDENT_AMBULATORY_CARE_PROVIDER_SITE_OTHER): Payer: Medicare PPO | Admitting: Urology

## 2021-06-24 ENCOUNTER — Ambulatory Visit (HOSPITAL_COMMUNITY)
Admission: RE | Admit: 2021-06-24 | Discharge: 2021-06-24 | Disposition: A | Discharge status: Home / Self Care | Payer: Medicare PPO | Source: Ambulatory Visit | Attending: Urology | Admitting: Urology

## 2021-06-24 ENCOUNTER — Encounter: Payer: Self-pay | Admitting: Urology

## 2021-06-24 VITALS — BP 99/64 | HR 71 | Ht 62.0 in | Wt 134.0 lb

## 2021-06-24 DIAGNOSIS — N1330 Unspecified hydronephrosis: Secondary | ICD-10-CM | POA: Diagnosis not present

## 2021-06-24 DIAGNOSIS — N200 Calculus of kidney: Secondary | ICD-10-CM | POA: Diagnosis not present

## 2021-06-24 DIAGNOSIS — N210 Calculus in bladder: Secondary | ICD-10-CM

## 2021-06-24 DIAGNOSIS — K449 Diaphragmatic hernia without obstruction or gangrene: Secondary | ICD-10-CM | POA: Diagnosis not present

## 2021-06-24 DIAGNOSIS — K862 Cyst of pancreas: Secondary | ICD-10-CM | POA: Diagnosis not present

## 2021-06-24 LAB — MICROSCOPIC EXAMINATION
RBC: NONE SEEN /hpf (ref 0–2)
Renal Epithel, UA: NONE SEEN /hpf

## 2021-06-24 LAB — URINALYSIS, ROUTINE W REFLEX MICROSCOPIC
Bilirubin, UA: NEGATIVE
Glucose, UA: NEGATIVE
Ketones, UA: NEGATIVE
Nitrite, UA: NEGATIVE
Protein,UA: NEGATIVE
RBC, UA: NEGATIVE
Specific Gravity, UA: 1.01 (ref 1.005–1.030)
Urobilinogen, Ur: 0.2 mg/dL (ref 0.2–1.0)
pH, UA: 6 (ref 5.0–7.5)

## 2021-06-24 NOTE — Progress Notes (Signed)
? ?06/24/2021 ?9:58 AM  ? ?[name] ?10/11/1950 ?[id number] ? ?Referring provider: Ignatius Specking, MD ?[address] ?[place],  [place] [address] ? ?Left hydronephrosis ? ? ?HPI: ?Ms Farr is a 71yo here for evaluation of hydronephrosis and nephrolithiasis. Her first stone event was 5 years ago. Starting 6 weeks ago she developed left lower quadrant pain. She underwent Abdominal US 3/27/202 showed mild to moderate left hydronephrosis and left renal calculi. NO flank pain currently. No significant LUTS. No hematuria.CT stone study from today shows a 74mm bladder calculus, 1cm left lower pole calculus and a 61mm right mid pole calculus.  ? ? ?PMH: ?No past medical history on file. ? ?Surgical History: ? ? ?Home Medications:  ?Allergies as of 06/24/2021   ? ?   Reactions  ? Aspirin Rash  ? Ceftin [cefuroxime Axetil]   ? Ciprofloxacin   ? Sulfamethoxazole-trimethoprim   ? ?  ? ?  ?Medication List  ?  ? ?  ? Accurate as of June 24, 2021  9:58 AM. If you have any questions, ask your nurse or doctor.  ?  ?  ? ?  ? ?baclofen 10 MG tablet ?Commonly known as: LIORESAL ?Take by mouth. ?  ?fenoprofen 600 MG Tabs tablet ?Commonly known as: NALFON ?Take 600 mg by mouth. ?  ?FLUoxetine 20 MG capsule ?Commonly known as: PROZAC ?Take by mouth. ?  ?ipratropium 0.03 % nasal spray ?Commonly known as: ATROVENT ?Place 1 spray into both nostrils 2 (two) times daily. ?  ?levocetirizine 5 MG tablet ?Commonly known as: XYZAL ?Take by mouth. ?  ?rosuvastatin 5 MG tablet ?Commonly known as: CRESTOR ?Take by mouth. ?  ?topiramate 100 MG tablet ?Commonly known as: TOPAMAX ?Take by mouth. ?  ?Womens 50+ Multi Vitamin Tabs ?Take by mouth. ?  ? ?  ? ? ?Allergies:  ?Allergies  ?Allergen Reactions  ? Aspirin Rash  ? Ceftin [Cefuroxime Axetil]   ? Ciprofloxacin   ? Sulfamethoxazole-Trimethoprim   ? ? ?Family History: ?No family history on file. ? ?Social History:  has no history on file for tobacco use, alcohol use, and drug use. ? ?ROS: ?All other review of  systems were reviewed and are negative except what is noted above in HPI ? ?Physical Exam: ?BP 99/64   Pulse 71   Ht 5\' 2"  (1.575 m)   Wt 134 lb (60.8 kg)   BMI 24.51 kg/m?   ?Constitutional:  Alert and oriented, No acute distress. ?HEENT: [place] AT, moist mucus membranes.  Trachea midline, no masses. ?Cardiovascular: No clubbing, cyanosis, or edema. ?Respiratory: Normal respiratory effort, no increased work of breathing. ?GI: Abdomen is soft, nontender, nondistended, no abdominal masses ?GU: No CVA tenderness.  ?Lymph: No cervical or inguinal lymphadenopathy. ?Skin: No rashes, bruises or suspicious lesions. ?Neurologic: Grossly intact, no focal deficits, moving all 4 extremities. ?Psychiatric: Normal mood and affect. ? ?Laboratory Data: ?No results found for: WBC, HGB, HCT, MCV, PLT ? ?No results found for: CREATININE ? ?No results found for: PSA ? ?No results found for: TESTOSTERONE ? ?No results found for: HGBA1C ? ?Urinalysis ?No results found for: COLORURINE, APPEARANCEUR, LABSPEC, PHURINE, GLUCOSEU, HGBUR, BILIRUBINUR, KETONESUR, PROTEINUR, UROBILINOGEN, NITRITE, LEUKOCYTESUR ? ?No results found for: LABMICR, WBCUA, RBCUA, LABEPIT, MUCUS, BACTERIA ? ?Pertinent Imaging: ?Ct stone study today: Images reviewed and discussed with the patient  ?No results found for this or any previous visit. ? ?Results for orders placed during the hospital encounter of 11/14/07 ? ?US VenoUS Imaging Bilateral ? ?Narrative ?Clinical data: Symptomatic  bilateral leg varicose veins, now 6 ?months status post transcatheter laser occlusion of the   greater ?saphenous vein. ? ?BILATERAL LOWER EXTREMITY VENOUS DOPPLER ULTRASOUND ? ?Technique: Gray-scale sonography with compression as well as color ?and duplex Doppler ultrasound were performed to evaluate   the ?saphenous vein and the deep venous system from the level of the ?common femoral vein through the popliteal and proximal calf veins. ? ?Findings: The lower extremity deep venous  systems demonstrate ?normal compressibility, phasicity, and augmentation. Posterior ?tibial veins unremarkable. No evidence of DVT. ? ?The greater saphenous veins are occluded throughout the treatment ?segment. Associated varicose veins in the thigh and calf region are ?decompressed. ? ?IMPRESSION ?1. Technically successful occlusion of bilateral greater saphenous ?veins along the treatment length without apparent complication. ?2. Normal deep venous systems. No DVT. ? ?Provider: Reece Levy, Lovina Reach, D. Arne Cleveland ? ?No results found for this or any previous visit. ? ?No results found for this or any previous visit. ? ?No results found for this or any previous visit. ? ?No results found for this or any previous visit. ? ?No results found for this or any previous visit. ? ?No results found for this or any previous visit. ? ? ?Assessment & Plan:   ? ?1. Hydronephrosis, unspecified hydronephrosis type ?-resolved ?- Urinalysis, Routine w reflex microscopic ? ?2. Nephrolithiasis ?-We discussed the management of kidney stones. These options include observation, ureteroscopy, shockwave lithotripsy (ESWL) and percutaneous nephrolithotomy (PCNL). We discussed which options are relevant to the patient's stone(s). We discussed the natural history of kidney stones as well as the complications of untreated stones and the impact on quality of life without treatment as well as with each of the above listed treatments. We also discussed the efficacy of each treatment in its ability to clear the stone burden. With any of these management options I discussed the signs and symptoms of infection and the need for emergent treatment should these be experienced. For each option we discussed the ability of each procedure to clear the patient of their stone burden.  ? ?For observation I described the risks which include but are not limited to silent renal damage, life-threatening infection, need for emergent surgery, failure to  pass stone and pain.  ? ?For ureteroscopy I described the risks which include bleeding, infection, damage to contiguous structures, positioning injury, ureteral stricture, ureteral avulsion, ureteral injury, need for prolonged ureteral stent, inability to perform ureteroscopy, need for an interval procedure, inability to clear stone burden, stent discomfort/pain, heart attack, stroke, pulmonary embolus and the inherent risks with general anesthesia.  ? ?For shockwave lithotripsy I described the risks which include arrhythmia, kidney contusion, kidney hemorrhage, need for transfusion, pain, inability to adequately break up stone, inability to pass stone fragments, Steinstrasse, infection associated with obstructing stones, need for alternate surgical procedure, need for repeat shockwave lithotripsy, MI, CVA, PE and the inherent risks with anesthesia/conscious sedation.  ? ?For PCNL I described the risks including positioning injury, pneumothorax, hydrothorax, need for chest tube, inability to clear stone burden, renal laceration, arterial venous fistula or malformation, need for embolization of kidney, loss of kidney or renal function, need for repeat procedure, need for prolonged nephrostomy tube, ureteral avulsion, MI, CVA, PE and the inherent risks of general anesthesia.  ? ?- The patient would like to proceed with observation. RTC 3 months with KUB ? ? ?No follow-ups on file. ? ?Nicolette Bang, MD ? ?[facility] ?  ?

## 2021-06-24 NOTE — Patient Instructions (Signed)
Dietary Guidelines to Help Prevent Kidney Stones Kidney stones are deposits of minerals and salts that form inside your kidneys. Your risk of developing kidney stones may be greater depending on your diet, your lifestyle, the medicines you take, and whether you have certain medical conditions. Most people can lower their chances of developing kidney stones by following the instructions below. Your dietitian may give you more specific instructions depending on your overall health and the type of kidney stones you tend to develop. What are tips for following this plan? Reading food labels  Choose foods with "no salt added" or "low-salt" labels. Limit your salt (sodium) intake to less than 1,500 mg a day. Choose foods with calcium for each meal and snack. Try to eat about 300 mg of calcium at each meal. Foods that contain 200-500 mg of calcium a serving include: 8 oz (237 mL) of milk, calcium-fortifiednon-dairy milk, and calcium-fortifiedfruit juice. Calcium-fortified means that calcium has been added to these drinks. 8 oz (237 mL) of kefir, yogurt, and soy yogurt. 4 oz (114 g) of tofu. 1 oz (28 g) of cheese. 1 cup (150 g) of dried figs. 1 cup (91 g) of cooked broccoli. One 3 oz (85 g) can of sardines or mackerel. Most people need 1,000-1,500 mg of calcium a day. Talk to your dietitian about how much calcium is recommended for you. Shopping Buy plenty of fresh fruits and vegetables. Most people do not need to avoid fruits and vegetables, even if these foods contain nutrients that may contribute to kidney stones. When shopping for convenience foods, choose: Whole pieces of fruit. Pre-made salads with dressing on the side. Low-fat fruit and yogurt smoothies. Avoid buying frozen meals or prepared deli foods. These can be high in sodium. Look for foods with live cultures, such as yogurt and kefir. Choose high-fiber grains, such as whole-wheat breads, oat bran, and wheat cereals. Cooking Do not add  salt to food when cooking. Place a salt shaker on the table and allow each person to add his or her own salt to taste. Use vegetable protein, such as beans, textured vegetable protein (TVP), or tofu, instead of meat in pasta, casseroles, and soups. Meal planning Eat less salt, if told by your dietitian. To do this: Avoid eating processed or pre-made food. Avoid eating fast food. Eat less animal protein, including cheese, meat, poultry, or fish, if told by your dietitian. To do this: Limit the number of times you have meat, poultry, fish, or cheese each week. Eat a diet free of meat at least 2 days a week. Eat only one serving each day of meat, poultry, fish, or seafood. When you prepare animal protein, cut pieces into small portion sizes. For most meat and fish, one serving is about the size of the palm of your hand. Eat at least five servings of fresh fruits and vegetables each day. To do this: Keep fruits and vegetables on hand for snacks. Eat one piece of fruit or a handful of berries with breakfast. Have a salad and fruit at lunch. Have two kinds of vegetables at dinner. Limit foods that are high in a substance called oxalate. These include: Spinach (cooked), rhubarb, beets, sweet potatoes, and Swiss chard. Peanuts. Potato chips, french fries, and baked potatoes with skin on. Nuts and nut products. Chocolate. If you regularly take a diuretic medicine, make sure to eat at least 1 or 2 servings of fruits or vegetables that are high in potassium each day. These include: Avocado. Banana. Orange, prune,   carrot, or tomato juice. Baked potato. Cabbage. Beans and split peas. Lifestyle  Drink enough fluid to keep your urine pale yellow. This is the most important thing you can do. Spread your fluid intake throughout the day. If you drink alcohol: Limit how much you use to: 0-1 drink a day for women who are not pregnant. 0-2 drinks a day for men. Be aware of how much alcohol is in your  drink. In the [place] one drink equals one 12 oz bottle of beer (355 mL), one 5 oz glass of wine (148 mL), or one 1 oz glass of hard liquor (44 mL). Lose weight if told by your health care provider. Work with your dietitian to find an eating plan and weight loss strategies that work best for you. General information Talk to your health care provider and dietitian about taking daily supplements. You may be told the following depending on your health and the cause of your kidney stones: Not to take supplements with vitamin C. To take a calcium supplement. To take a daily probiotic supplement. To take other supplements such as magnesium, fish oil, or vitamin B6. Take over-the-counter and prescription medicines only as told by your health care provider. These include supplements. What foods should I limit? Limit your intake of the following foods, or eat them as told by your dietitian. Vegetables Spinach. Rhubarb. Beets. Canned vegetables. Pickles. Olives. Baked potatoes with skin. Grains Wheat bran. Baked goods. Salted crackers. Cereals high in sugar. Meats and other proteins Nuts. Nut butters. Large portions of meat, poultry, or fish. Salted, precooked, or cured meats, such as sausages, meat loaves, and hot dogs. Dairy Cheese. Beverages Regular soft drinks. Regular vegetable juice. Seasonings and condiments Seasoning blends with salt. Salad dressings. Soy sauce. Ketchup. Barbecue sauce. Other foods Canned soups. Canned pasta sauce. Casseroles. Pizza. Lasagna. Frozen meals. Potato chips. French fries. The items listed above may not be a complete list of foods and beverages you should limit. Contact a dietitian for more information. What foods should I avoid? Talk to your dietitian about specific foods you should avoid based on the type of kidney stones you have and your overall health. Fruits Grapefruit. The item listed above may not be a complete list of foods and beverages you should  avoid. Contact a dietitian for more information. Summary Kidney stones are deposits of minerals and salts that form inside your kidneys. You can lower your risk of kidney stones by making changes to your diet. The most important thing you can do is drink enough fluid. Drink enough fluid to keep your urine pale yellow. Talk to your dietitian about how much calcium you should have each day, and eat less salt and animal protein as told by your dietitian. This information is not intended to replace advice given to you by your health care provider. Make sure you discuss any questions you have with your health care provider. Document Revised: 10/27/2020 Document Reviewed: 10/27/2020 [employer]vier Patient Education  2023 [employer]  

## 2021-07-23 ENCOUNTER — Other Ambulatory Visit (HOSPITAL_COMMUNITY): Payer: Self-pay | Admitting: Nurse Practitioner

## 2021-07-23 ENCOUNTER — Other Ambulatory Visit: Payer: Self-pay | Admitting: Nurse Practitioner

## 2021-07-23 DIAGNOSIS — I8393 Asymptomatic varicose veins of bilateral lower extremities: Secondary | ICD-10-CM | POA: Diagnosis not present

## 2021-07-23 DIAGNOSIS — Z299 Encounter for prophylactic measures, unspecified: Secondary | ICD-10-CM | POA: Diagnosis not present

## 2021-07-23 DIAGNOSIS — M79605 Pain in left leg: Secondary | ICD-10-CM

## 2021-07-23 DIAGNOSIS — M79662 Pain in left lower leg: Secondary | ICD-10-CM | POA: Diagnosis not present

## 2021-07-23 DIAGNOSIS — F331 Major depressive disorder, recurrent, moderate: Secondary | ICD-10-CM | POA: Diagnosis not present

## 2021-07-23 DIAGNOSIS — E1165 Type 2 diabetes mellitus with hyperglycemia: Secondary | ICD-10-CM | POA: Diagnosis not present

## 2021-07-24 ENCOUNTER — Ambulatory Visit (HOSPITAL_COMMUNITY)
Admission: RE | Admit: 2021-07-24 | Discharge: 2021-07-24 | Disposition: A | Discharge status: Home / Self Care | Payer: Medicare PPO | Source: Ambulatory Visit | Attending: Nurse Practitioner | Admitting: Nurse Practitioner

## 2021-07-24 DIAGNOSIS — M79605 Pain in left leg: Secondary | ICD-10-CM | POA: Insufficient documentation

## 2021-07-24 DIAGNOSIS — M79662 Pain in left lower leg: Secondary | ICD-10-CM | POA: Diagnosis not present

## 2021-09-08 DIAGNOSIS — Z299 Encounter for prophylactic measures, unspecified: Secondary | ICD-10-CM | POA: Diagnosis not present

## 2021-09-08 DIAGNOSIS — F331 Major depressive disorder, recurrent, moderate: Secondary | ICD-10-CM | POA: Diagnosis not present

## 2021-09-08 DIAGNOSIS — L309 Dermatitis, unspecified: Secondary | ICD-10-CM | POA: Diagnosis not present

## 2021-09-08 DIAGNOSIS — E1165 Type 2 diabetes mellitus with hyperglycemia: Secondary | ICD-10-CM | POA: Diagnosis not present

## 2021-09-14 DIAGNOSIS — G43019 Migraine without aura, intractable, without status migrainosus: Secondary | ICD-10-CM | POA: Diagnosis not present

## 2021-09-14 DIAGNOSIS — M542 Cervicalgia: Secondary | ICD-10-CM | POA: Diagnosis not present

## 2021-09-14 DIAGNOSIS — G518 Other disorders of facial nerve: Secondary | ICD-10-CM | POA: Diagnosis not present

## 2021-09-14 DIAGNOSIS — M791 Myalgia, unspecified site: Secondary | ICD-10-CM | POA: Diagnosis not present

## 2021-09-14 DIAGNOSIS — G43719 Chronic migraine without aura, intractable, without status migrainosus: Secondary | ICD-10-CM | POA: Diagnosis not present

## 2021-09-16 DIAGNOSIS — Z1331 Encounter for screening for depression: Secondary | ICD-10-CM | POA: Diagnosis not present

## 2021-09-16 DIAGNOSIS — Z1339 Encounter for screening examination for other mental health and behavioral disorders: Secondary | ICD-10-CM | POA: Diagnosis not present

## 2021-09-16 DIAGNOSIS — Z299 Encounter for prophylactic measures, unspecified: Secondary | ICD-10-CM | POA: Diagnosis not present

## 2021-09-16 DIAGNOSIS — Z789 Other specified health status: Secondary | ICD-10-CM | POA: Diagnosis not present

## 2021-09-16 DIAGNOSIS — Z79899 Other long term (current) drug therapy: Secondary | ICD-10-CM | POA: Diagnosis not present

## 2021-09-16 DIAGNOSIS — Z0000 Encounter for general adult medical examination without abnormal findings: Secondary | ICD-10-CM | POA: Diagnosis not present

## 2021-09-16 DIAGNOSIS — Z7189 Other specified counseling: Secondary | ICD-10-CM | POA: Diagnosis not present

## 2021-09-16 DIAGNOSIS — R5383 Other fatigue: Secondary | ICD-10-CM | POA: Diagnosis not present

## 2021-09-16 DIAGNOSIS — E7800 Pure hypercholesterolemia, unspecified: Secondary | ICD-10-CM | POA: Diagnosis not present

## 2021-09-16 DIAGNOSIS — E559 Vitamin D deficiency, unspecified: Secondary | ICD-10-CM | POA: Diagnosis not present

## 2021-09-25 ENCOUNTER — Ambulatory Visit: Payer: Medicare PPO | Admitting: Urology

## 2021-09-28 ENCOUNTER — Ambulatory Visit (HOSPITAL_COMMUNITY)
Admission: RE | Admit: 2021-09-28 | Discharge: 2021-09-28 | Disposition: A | Discharge status: Home / Self Care | Payer: Medicare PPO | Source: Ambulatory Visit | Attending: Urology | Admitting: Urology

## 2021-09-28 DIAGNOSIS — I878 Other specified disorders of veins: Secondary | ICD-10-CM | POA: Diagnosis not present

## 2021-09-28 DIAGNOSIS — N200 Calculus of kidney: Secondary | ICD-10-CM

## 2021-09-28 DIAGNOSIS — N210 Calculus in bladder: Secondary | ICD-10-CM | POA: Diagnosis not present

## 2021-09-29 ENCOUNTER — Ambulatory Visit: Payer: Medicare PPO | Admitting: Urology

## 2021-09-29 ENCOUNTER — Encounter: Payer: Self-pay | Admitting: Urology

## 2021-09-29 VITALS — BP 103/66 | HR 72 | Ht 63.0 in | Wt 132.0 lb

## 2021-09-29 DIAGNOSIS — N200 Calculus of kidney: Secondary | ICD-10-CM | POA: Diagnosis not present

## 2021-09-29 LAB — URINALYSIS, ROUTINE W REFLEX MICROSCOPIC
Bilirubin, UA: NEGATIVE
Glucose, UA: NEGATIVE
Ketones, UA: NEGATIVE
Nitrite, UA: NEGATIVE
Protein,UA: NEGATIVE
RBC, UA: NEGATIVE
Specific Gravity, UA: 1.015 (ref 1.005–1.030)
Urobilinogen, Ur: 0.2 mg/dL (ref 0.2–1.0)
pH, UA: 7 (ref 5.0–7.5)

## 2021-09-29 LAB — MICROSCOPIC EXAMINATION
RBC, Urine: NONE SEEN /hpf (ref 0–2)
Renal Epithel, UA: NONE SEEN /hpf

## 2021-09-29 NOTE — Progress Notes (Signed)
09/29/2021 11:21 AM   Erica Cain [birth year] [id number]  Referring provider: Ignatius Specking, MD [address] [place],  [place] [address]  Followup nephrolithiasis   HPI: Erica Cain is a 71yo here for followup for nephrolithiasis. No stone events since last visit. KUb from yesterday shows 3 last renal calculi which are stable in size. She denies any flank pain. No significant LUTS   PMH: No past medical history on file.  Surgical History: No past surgical history on file.  Home Medications:  Allergies as of 09/29/2021       Reactions   Aspirin Rash   Ceftin [cefuroxime Axetil]    Ciprofloxacin    Sulfamethoxazole-trimethoprim         Medication List        Accurate as of September 29, 2021 11:21 AM. If you have any questions, ask your nurse or doctor.          baclofen 10 MG tablet Commonly known as: LIORESAL Take by mouth.   fenoprofen 600 MG Tabs tablet Commonly known as: NALFON Take 600 mg by mouth.   FLUoxetine 20 MG capsule Commonly known as: PROZAC Take by mouth.   ipratropium 0.03 % nasal spray Commonly known as: ATROVENT Place 1 spray into both nostrils 2 (two) times daily.   levocetirizine 5 MG tablet Commonly known as: XYZAL Take by mouth.   rosuvastatin 5 MG tablet Commonly known as: CRESTOR Take by mouth.   topiramate 100 MG tablet Commonly known as: TOPAMAX Take by mouth.   Womens 50+ Multi Vitamin Tabs Take by mouth.        Allergies:  Allergies  Allergen Reactions   Aspirin Rash   Ceftin [Cefuroxime Axetil]    Ciprofloxacin    Sulfamethoxazole-Trimethoprim     Family History: No family history on file.  Social History:  has no history on file for tobacco use, alcohol use, and drug use.  ROS: All other review of systems were reviewed and are negative except what is noted above in HPI  Physical Exam: BP 103/66   Pulse 72   Ht 5\' 3"  (1.6 m)   Wt 132 lb (59.9 kg)   BMI 23.38 kg/m   Constitutional:  Alert and  oriented, No acute distress. HEENT: [place] AT, moist mucus membranes.  Trachea midline, no masses. Cardiovascular: No clubbing, cyanosis, or edema. Respiratory: Normal respiratory effort, no increased work of breathing. GI: Abdomen is soft, nontender, nondistended, no abdominal masses GU: No CVA tenderness.  Lymph: No cervical or inguinal lymphadenopathy. Skin: No rashes, bruises or suspicious lesions. Neurologic: Grossly intact, no focal deficits, moving all 4 extremities. Psychiatric: Normal mood and affect.  Laboratory Data: No results found for: "WBC", "HGB", "HCT", "MCV", "PLT"  No results found for: "CREATININE"  No results found for: "PSA"  No results found for: "TESTOSTERONE"  No results found for: "HGBA1C"  Urinalysis    Component Value Date/Time   APPEARANCEUR Clear 06/24/2021 0943   GLUCOSEU Negative 06/24/2021 0943   BILIRUBINUR Negative 06/24/2021 0943   PROTEINUR Negative 06/24/2021 0943   NITRITE Negative 06/24/2021 0943   LEUKOCYTESUR 1+ (A) 06/24/2021 0943    Lab Results  Component Value Date   LABMICR See below: 06/24/2021   WBCUA 6-10 (A) 06/24/2021   LABEPIT 0-10 06/24/2021   BACTERIA Few 06/24/2021    Pertinent Imaging: KUb yesterday: Images reviewed and discussed with the patient  Results for orders placed during the hospital encounter of 09/28/21  Abdomen 1 view (KUB)  Narrative CLINICAL  DATA:  Nephrolithiasis.  EXAM: ABDOMEN - 1 VIEW  COMPARISON:  CT 06/24/2021  FINDINGS: Three adjacent stones project over the lower left kidney largest measuring 7 mm. The punctate right intrarenal calculi are not well seen on the current exam, obscured by overlying stool. No definite ureteral stones. Previous small bladder stone may persist, although there are multiple pelvic phleboliths which partially obscures assessment for bladder calculi. Large volume of stool in the colon, no bowel obstruction.  IMPRESSION: 1. Three adjacent stones project  over the lower left kidney largest measuring 7 mm. 2. Known right renal calculi are not well seen by radiograph. 3. Previous small bladder stone may persist, although there are multiple pelvic phleboliths which partially obscures assessment for bladder calculi.   Electronically Signed By: Narda Rutherford M.D. On: 09/28/2021 15:38  Results for orders placed during the hospital encounter of 11/14/07  US VenoUS Imaging Bilateral  Narrative Clinical data: Symptomatic bilateral leg varicose veins, now 6 months status post transcatheter laser occlusion of the   greater saphenous vein.  BILATERAL LOWER EXTREMITY VENOUS DOPPLER ULTRASOUND  Technique: Gray-scale sonography with compression as well as color and duplex Doppler ultrasound were performed to evaluate   the saphenous vein and the deep venous system from the level of the common femoral vein through the popliteal and proximal calf veins.  Findings: The lower extremity deep venous systems demonstrate normal compressibility, phasicity, and augmentation. Posterior tibial veins unremarkable. No evidence of DVT.  The greater saphenous veins are occluded throughout the treatment segment. Associated varicose veins in the thigh and calf region are decompressed.  IMPRESSION 1. Technically successful occlusion of bilateral greater saphenous veins along the treatment length without apparent complication. 2. Normal deep venous systems. No DVT.  Provider: Amado Nash, Talbert Forest, D. Oley Balm  No results found for this or any previous visit.  No results found for this or any previous visit.  No results found for this or any previous visit.  No results found for this or any previous visit.  No results found for this or any previous visit.  Results for orders placed in visit on 06/24/21  CT RENAL STONE STUDY  Narrative CLINICAL DATA:  Left flank pain for 1 month. History of kidney stones.  EXAM: CT ABDOMEN AND  PELVIS WITHOUT CONTRAST  TECHNIQUE: Multidetector CT imaging of the abdomen and pelvis was performed following the standard protocol without IV contrast.  RADIATION DOSE REDUCTION: This exam was performed according to the departmental dose-optimization program which includes automated exposure control, adjustment of the mA and/or kV according to patient size and/or use of iterative reconstruction technique.  COMPARISON:  04/29/2014.  FINDINGS: Lower chest: Scattered volume loss in the lung bases. Heart is at the upper limits of normal in size. Atherosclerotic calcification of the aorta, aortic valve and coronary arteries. No pericardial or pleural effusion. Distal esophagus is grossly unremarkable.  Hepatobiliary: Liver and gallbladder are unremarkable. No biliary ductal dilatation.  Pancreas: Low-attenuation lesions off the proximal body and tail the pancreas measure 1.6 x 1.7 cm (2/21) and 0.8 x 1.0 cm (2/23), respectively. The larger lesion off the body of the pancreas is slightly larger than on 04/29/2014, at which time it measured 9 x 12 mm. The tail lesion appears new. Otherwise unremarkable.  Spleen: Negative.  Adrenals/Urinary Tract: Adrenal glands are unremarkable. Stones in the kidneys bilaterally. No ureteral stones or obstruction. 3 mm stone is seen dependently in the inferior bladder (2/75).  Stomach/Bowel: Small hiatal hernia. Stomach and small  bowel are otherwise unremarkable. Appendix is minimally prominent but contains air and has no Peri appendiceal inflammatory stranding. Fair amount of stool in the colon is indicative of constipation.  Vascular/Lymphatic: Atherosclerotic calcification of the aorta. No pathologically enlarged lymph nodes.  Reproductive: Uterus is visualized.  No adnexal mass.  Other: Trace pelvic free fluid. Mesenteries and peritoneum are otherwise unremarkable.  Musculoskeletal: Degenerative changes in the spine. No worrisome lytic  or sclerotic lesions.  IMPRESSION: 1. Bilateral renal stones. 3 mm stone in the bladder, possibly recently passed. No hydronephrosis. 2. Low-attenuation lesions off the body, new or enlarged from 2016. Benign lesions are favored. Malignancy cannot be excluded. Follow-up CT abdomen without and with contrast in 1 year is recommended, as clinically indicated. This recommendation follows ACR consensus guidelines: Management of Incidental Pancreatic Cysts: A White Paper of the ACR Incidental Findings Committee. J Am Coll Radiol 2017;14:911-923. 3. Aortic atherosclerosis (ICD10-I70.0). Coronary artery calcification.   Electronically Signed By: Leanna Battles M.D. On: 06/24/2021 11:50   Assessment & Plan:    1. Nephrolithiasis -We discussed the management of kidney stones. These options include observation, ureteroscopy, shockwave lithotripsy (ESWL) and percutaneous nephrolithotomy (PCNL). We discussed which options are relevant to the patient's stone(s). We discussed the natural history of kidney stones as well as the complications of untreated stones and the impact on quality of life without treatment as well as with each of the above listed treatments. We also discussed the efficacy of each treatment in its ability to clear the stone burden. With any of these management options I discussed the signs and symptoms of infection and the need for emergent treatment should these be experienced. For each option we discussed the ability of each procedure to clear the patient of their stone burden.   For observation I described the risks which include but are not limited to silent renal damage, life-threatening infection, need for emergent surgery, failure to pass stone and pain.   For ureteroscopy I described the risks which include bleeding, infection, damage to contiguous structures, positioning injury, ureteral stricture, ureteral avulsion, ureteral injury, need for prolonged ureteral stent,  inability to perform ureteroscopy, need for an interval procedure, inability to clear stone burden, stent discomfort/pain, heart attack, stroke, pulmonary embolus and the inherent risks with general anesthesia.   For shockwave lithotripsy I described the risks which include arrhythmia, kidney contusion, kidney hemorrhage, need for transfusion, pain, inability to adequately break up stone, inability to pass stone fragments, Steinstrasse, infection associated with obstructing stones, need for alternate surgical procedure, need for repeat shockwave lithotripsy, MI, CVA, PE and the inherent risks with anesthesia/conscious sedation.   For PCNL I described the risks including positioning injury, pneumothorax, hydrothorax, need for chest tube, inability to clear stone burden, renal laceration, arterial venous fistula or malformation, need for embolization of kidney, loss of kidney or renal function, need for repeat procedure, need for prolonged nephrostomy tube, ureteral avulsion, MI, CVA, PE and the inherent risks of general anesthesia.   - The patient would like to proceed with observation. RTC 6 months with KUB  - Urinalysis, Routine w reflex microscopic   No follow-ups on file.  Wilkie Aye, MD  [facility] [place]

## 2021-09-29 NOTE — Patient Instructions (Signed)
Dietary Guidelines to Help Prevent Kidney Stones Kidney stones are deposits of minerals and salts that form inside your kidneys. Your risk of developing kidney stones may be greater depending on your diet, your lifestyle, the medicines you take, and whether you have certain medical conditions. Most people can lower their chances of developing kidney stones by following the instructions below. Your dietitian may give you more specific instructions depending on your overall health and the type of kidney stones you tend to develop. What are tips for following this plan? Reading food labels  Choose foods with "no salt added" or "low-salt" labels. Limit your salt (sodium) intake to less than 1,500 mg a day. Choose foods with calcium for each meal and snack. Try to eat about 300 mg of calcium at each meal. Foods that contain 200-500 mg of calcium a serving include: 8 oz (237 mL) of milk, calcium-fortifiednon-dairy milk, and calcium-fortifiedfruit juice. Calcium-fortified means that calcium has been added to these drinks. 8 oz (237 mL) of kefir, yogurt, and soy yogurt. 4 oz (114 g) of tofu. 1 oz (28 g) of cheese. 1 cup (150 g) of dried figs. 1 cup (91 g) of cooked broccoli. One 3 oz (85 g) can of sardines or mackerel. Most people need 1,000-1,500 mg of calcium a day. Talk to your dietitian about how much calcium is recommended for you. Shopping Buy plenty of fresh fruits and vegetables. Most people do not need to avoid fruits and vegetables, even if these foods contain nutrients that may contribute to kidney stones. When shopping for convenience foods, choose: Whole pieces of fruit. Pre-made salads with dressing on the side. Low-fat fruit and yogurt smoothies. Avoid buying frozen meals or prepared deli foods. These can be high in sodium. Look for foods with live cultures, such as yogurt and kefir. Choose high-fiber grains, such as whole-wheat breads, oat bran, and wheat cereals. Cooking Do not add  salt to food when cooking. Place a salt shaker on the table and allow each person to add his or her own salt to taste. Use vegetable protein, such as beans, textured vegetable protein (TVP), or tofu, instead of meat in pasta, casseroles, and soups. Meal planning Eat less salt, if told by your dietitian. To do this: Avoid eating processed or pre-made food. Avoid eating fast food. Eat less animal protein, including cheese, meat, poultry, or fish, if told by your dietitian. To do this: Limit the number of times you have meat, poultry, fish, or cheese each week. Eat a diet free of meat at least 2 days a week. Eat only one serving each day of meat, poultry, fish, or seafood. When you prepare animal protein, cut pieces into small portion sizes. For most meat and fish, one serving is about the size of the palm of your hand. Eat at least five servings of fresh fruits and vegetables each day. To do this: Keep fruits and vegetables on hand for snacks. Eat one piece of fruit or a handful of berries with breakfast. Have a salad and fruit at lunch. Have two kinds of vegetables at dinner. Limit foods that are high in a substance called oxalate. These include: Spinach (cooked), rhubarb, beets, sweet potatoes, and Swiss chard. Peanuts. Potato chips, french fries, and baked potatoes with skin on. Nuts and nut products. Chocolate. If you regularly take a diuretic medicine, make sure to eat at least 1 or 2 servings of fruits or vegetables that are high in potassium each day. These include: Avocado. Banana. Orange, prune,   carrot, or tomato juice. Baked potato. Cabbage. Beans and split peas. Lifestyle  Drink enough fluid to keep your urine pale yellow. This is the most important thing you can do. Spread your fluid intake throughout the day. If you drink alcohol: Limit how much you use to: 0-1 drink a day for women who are not pregnant. 0-2 drinks a day for men. Be aware of how much alcohol is in your  drink. In the [place] one drink equals one 12 oz bottle of beer (355 mL), one 5 oz glass of wine (148 mL), or one 1 oz glass of hard liquor (44 mL). Lose weight if told by your health care provider. Work with your dietitian to find an eating plan and weight loss strategies that work best for you. General information Talk to your health care provider and dietitian about taking daily supplements. You may be told the following depending on your health and the cause of your kidney stones: Not to take supplements with vitamin C. To take a calcium supplement. To take a daily probiotic supplement. To take other supplements such as magnesium, fish oil, or vitamin B6. Take over-the-counter and prescription medicines only as told by your health care provider. These include supplements. What foods should I limit? Limit your intake of the following foods, or eat them as told by your dietitian. Vegetables Spinach. Rhubarb. Beets. Canned vegetables. Pickles. Olives. Baked potatoes with skin. Grains Wheat bran. Baked goods. Salted crackers. Cereals high in sugar. Meats and other proteins Nuts. Nut butters. Large portions of meat, poultry, or fish. Salted, precooked, or cured meats, such as sausages, meat loaves, and hot dogs. Dairy Cheese. Beverages Regular soft drinks. Regular vegetable juice. Seasonings and condiments Seasoning blends with salt. Salad dressings. Soy sauce. Ketchup. Barbecue sauce. Other foods Canned soups. Canned pasta sauce. Casseroles. Pizza. Lasagna. Frozen meals. Potato chips. French fries. The items listed above may not be a complete list of foods and beverages you should limit. Contact a dietitian for more information. What foods should I avoid? Talk to your dietitian about specific foods you should avoid based on the type of kidney stones you have and your overall health. Fruits Grapefruit. The item listed above may not be a complete list of foods and beverages you should  avoid. Contact a dietitian for more information. Summary Kidney stones are deposits of minerals and salts that form inside your kidneys. You can lower your risk of kidney stones by making changes to your diet. The most important thing you can do is drink enough fluid. Drink enough fluid to keep your urine pale yellow. Talk to your dietitian about how much calcium you should have each day, and eat less salt and animal protein as told by your dietitian. This information is not intended to replace advice given to you by your health care provider. Make sure you discuss any questions you have with your health care provider. Document Revised: 10/27/2020 Document Reviewed: 10/27/2020 [employer]vier Patient Education  2023 [employer]  

## 2021-10-09 DIAGNOSIS — Z79899 Other long term (current) drug therapy: Secondary | ICD-10-CM | POA: Diagnosis not present

## 2021-10-09 DIAGNOSIS — M859 Disorder of bone density and structure, unspecified: Secondary | ICD-10-CM | POA: Diagnosis not present

## 2021-10-09 DIAGNOSIS — E2839 Other primary ovarian failure: Secondary | ICD-10-CM | POA: Diagnosis not present

## 2021-10-13 DIAGNOSIS — Z1211 Encounter for screening for malignant neoplasm of colon: Secondary | ICD-10-CM | POA: Diagnosis not present

## 2021-10-13 DIAGNOSIS — Z1212 Encounter for screening for malignant neoplasm of rectum: Secondary | ICD-10-CM | POA: Diagnosis not present

## 2021-10-23 DIAGNOSIS — H43811 Vitreous degeneration, right eye: Secondary | ICD-10-CM | POA: Diagnosis not present

## 2021-11-19 DIAGNOSIS — R195 Other fecal abnormalities: Secondary | ICD-10-CM | POA: Diagnosis not present

## 2021-11-30 DIAGNOSIS — D129 Benign neoplasm of anus and anal canal: Secondary | ICD-10-CM | POA: Diagnosis not present

## 2021-11-30 DIAGNOSIS — D123 Benign neoplasm of transverse colon: Secondary | ICD-10-CM | POA: Diagnosis not present

## 2021-11-30 DIAGNOSIS — E119 Type 2 diabetes mellitus without complications: Secondary | ICD-10-CM | POA: Diagnosis not present

## 2021-11-30 DIAGNOSIS — Z881 Allergy status to other antibiotic agents status: Secondary | ICD-10-CM | POA: Diagnosis not present

## 2021-11-30 DIAGNOSIS — K621 Rectal polyp: Secondary | ICD-10-CM | POA: Diagnosis not present

## 2021-11-30 DIAGNOSIS — Z1211 Encounter for screening for malignant neoplasm of colon: Secondary | ICD-10-CM | POA: Diagnosis not present

## 2021-11-30 DIAGNOSIS — Z79899 Other long term (current) drug therapy: Secondary | ICD-10-CM | POA: Diagnosis not present

## 2021-11-30 DIAGNOSIS — K635 Polyp of colon: Secondary | ICD-10-CM | POA: Diagnosis not present

## 2021-11-30 DIAGNOSIS — R195 Other fecal abnormalities: Secondary | ICD-10-CM | POA: Diagnosis not present

## 2021-11-30 DIAGNOSIS — Z888 Allergy status to other drugs, medicaments and biological substances status: Secondary | ICD-10-CM | POA: Diagnosis not present

## 2021-12-15 DIAGNOSIS — M542 Cervicalgia: Secondary | ICD-10-CM | POA: Diagnosis not present

## 2021-12-15 DIAGNOSIS — M791 Myalgia, unspecified site: Secondary | ICD-10-CM | POA: Diagnosis not present

## 2021-12-15 DIAGNOSIS — G518 Other disorders of facial nerve: Secondary | ICD-10-CM | POA: Diagnosis not present

## 2021-12-15 DIAGNOSIS — G43719 Chronic migraine without aura, intractable, without status migrainosus: Secondary | ICD-10-CM | POA: Diagnosis not present

## 2021-12-16 DIAGNOSIS — Z789 Other specified health status: Secondary | ICD-10-CM | POA: Diagnosis not present

## 2021-12-16 DIAGNOSIS — Z299 Encounter for prophylactic measures, unspecified: Secondary | ICD-10-CM | POA: Diagnosis not present

## 2021-12-16 DIAGNOSIS — N390 Urinary tract infection, site not specified: Secondary | ICD-10-CM | POA: Diagnosis not present

## 2021-12-16 DIAGNOSIS — E1165 Type 2 diabetes mellitus with hyperglycemia: Secondary | ICD-10-CM | POA: Diagnosis not present

## 2021-12-16 DIAGNOSIS — R350 Frequency of micturition: Secondary | ICD-10-CM | POA: Diagnosis not present

## 2021-12-17 DIAGNOSIS — D123 Benign neoplasm of transverse colon: Secondary | ICD-10-CM | POA: Diagnosis not present

## 2021-12-28 DIAGNOSIS — Z1231 Encounter for screening mammogram for malignant neoplasm of breast: Secondary | ICD-10-CM | POA: Diagnosis not present

## 2021-12-29 DIAGNOSIS — Z299 Encounter for prophylactic measures, unspecified: Secondary | ICD-10-CM | POA: Diagnosis not present

## 2021-12-29 DIAGNOSIS — R350 Frequency of micturition: Secondary | ICD-10-CM | POA: Diagnosis not present

## 2021-12-29 DIAGNOSIS — E119 Type 2 diabetes mellitus without complications: Secondary | ICD-10-CM | POA: Diagnosis not present

## 2021-12-29 DIAGNOSIS — N390 Urinary tract infection, site not specified: Secondary | ICD-10-CM | POA: Diagnosis not present

## 2022-01-08 DIAGNOSIS — Z299 Encounter for prophylactic measures, unspecified: Secondary | ICD-10-CM | POA: Diagnosis not present

## 2022-01-08 DIAGNOSIS — R350 Frequency of micturition: Secondary | ICD-10-CM | POA: Diagnosis not present

## 2022-01-08 DIAGNOSIS — R21 Rash and other nonspecific skin eruption: Secondary | ICD-10-CM | POA: Diagnosis not present

## 2022-03-16 DIAGNOSIS — D485 Neoplasm of uncertain behavior of skin: Secondary | ICD-10-CM | POA: Diagnosis not present

## 2022-03-16 DIAGNOSIS — C44319 Basal cell carcinoma of skin of other parts of face: Secondary | ICD-10-CM | POA: Diagnosis not present

## 2022-03-23 DIAGNOSIS — M791 Myalgia, unspecified site: Secondary | ICD-10-CM | POA: Diagnosis not present

## 2022-03-23 DIAGNOSIS — M542 Cervicalgia: Secondary | ICD-10-CM | POA: Diagnosis not present

## 2022-03-23 DIAGNOSIS — G43719 Chronic migraine without aura, intractable, without status migrainosus: Secondary | ICD-10-CM | POA: Diagnosis not present

## 2022-03-23 DIAGNOSIS — G518 Other disorders of facial nerve: Secondary | ICD-10-CM | POA: Diagnosis not present

## 2022-03-30 DIAGNOSIS — E1165 Type 2 diabetes mellitus with hyperglycemia: Secondary | ICD-10-CM | POA: Diagnosis not present

## 2022-03-30 DIAGNOSIS — Z299 Encounter for prophylactic measures, unspecified: Secondary | ICD-10-CM | POA: Diagnosis not present

## 2022-03-30 DIAGNOSIS — Z6823 Body mass index (BMI) 23.0-23.9, adult: Secondary | ICD-10-CM | POA: Diagnosis not present

## 2022-03-30 DIAGNOSIS — F331 Major depressive disorder, recurrent, moderate: Secondary | ICD-10-CM | POA: Diagnosis not present

## 2022-04-14 ENCOUNTER — Ambulatory Visit (HOSPITAL_COMMUNITY)
Admission: RE | Admit: 2022-04-14 | Discharge: 2022-04-14 | Disposition: A | Discharge status: Home / Self Care | Payer: Medicare PPO | Source: Ambulatory Visit | Attending: Urology | Admitting: Urology

## 2022-04-14 DIAGNOSIS — N200 Calculus of kidney: Secondary | ICD-10-CM | POA: Insufficient documentation

## 2022-04-14 DIAGNOSIS — I878 Other specified disorders of veins: Secondary | ICD-10-CM | POA: Diagnosis not present

## 2022-04-16 ENCOUNTER — Ambulatory Visit: Payer: Medicare PPO | Admitting: Urology

## 2022-04-16 ENCOUNTER — Ambulatory Visit (HOSPITAL_COMMUNITY)
Admission: RE | Admit: 2022-04-16 | Discharge: 2022-04-16 | Disposition: A | Discharge status: Home / Self Care | Payer: Medicare PPO | Source: Ambulatory Visit | Attending: Urology | Admitting: Urology

## 2022-04-16 VITALS — BP 103/63 | HR 69

## 2022-04-16 DIAGNOSIS — N210 Calculus in bladder: Secondary | ICD-10-CM | POA: Diagnosis not present

## 2022-04-16 DIAGNOSIS — N200 Calculus of kidney: Secondary | ICD-10-CM | POA: Insufficient documentation

## 2022-04-16 DIAGNOSIS — I700 Atherosclerosis of aorta: Secondary | ICD-10-CM | POA: Diagnosis not present

## 2022-04-16 DIAGNOSIS — K862 Cyst of pancreas: Secondary | ICD-10-CM | POA: Diagnosis not present

## 2022-04-16 LAB — URINALYSIS, ROUTINE W REFLEX MICROSCOPIC
Bilirubin, UA: NEGATIVE
Glucose, UA: NEGATIVE
Ketones, UA: NEGATIVE
Nitrite, UA: NEGATIVE
Protein,UA: NEGATIVE
RBC, UA: NEGATIVE
Specific Gravity, UA: 1.01 (ref 1.005–1.030)
Urobilinogen, Ur: 0.2 mg/dL (ref 0.2–1.0)
pH, UA: 7 (ref 5.0–7.5)

## 2022-04-16 LAB — MICROSCOPIC EXAMINATION
Bacteria, UA: NONE SEEN
RBC, Urine: NONE SEEN /hpf (ref 0–2)

## 2022-04-16 NOTE — Progress Notes (Signed)
04/16/2022 10:12 AM   Erica Cain 1950-04-04 [id number]  Referring provider: Glenda Chroman, MD [place],  [place] [address]  No chief complaint on file.   HPI: KUB from 04/14/2022 shows left lower pole calculi and possible 1cm right UPJ calculus.    PMH: No past medical history on file.  Surgical History: No past surgical history on file.  Home Medications:  Allergies as of 04/16/2022       Reactions   Aspirin Rash   Ceftin [cefuroxime Axetil]    Ciprofloxacin    Sulfamethoxazole-trimethoprim         Medication List        Accurate as of April 16, 2022 10:12 AM. If you have any questions, ask your nurse or doctor.          baclofen 10 MG tablet Commonly known as: LIORESAL Take by mouth.   fenoprofen 600 MG Tabs tablet Commonly known as: NALFON Take 600 mg by mouth.   FLUoxetine 20 MG capsule Commonly known as: PROZAC Take by mouth.   ipratropium 0.03 % nasal spray Commonly known as: ATROVENT Place 1 spray into both nostrils 2 (two) times daily.   levocetirizine 5 MG tablet Commonly known as: XYZAL Take by mouth.   rosuvastatin 5 MG tablet Commonly known as: CRESTOR Take by mouth.   topiramate 100 MG tablet Commonly known as: TOPAMAX Take by mouth.   Womens 50+ Multi Vitamin Tabs Take by mouth.        Allergies:  Allergies  Allergen Reactions   Aspirin Rash   Ceftin [Cefuroxime Axetil]    Ciprofloxacin    Sulfamethoxazole-Trimethoprim     Family History: No family history on file.  Social History:  has no history on file for tobacco use, alcohol use, and drug use.  ROS: All other review of systems were reviewed and are negative except what is noted above in HPI  Physical Exam: BP 103/63   Pulse 69   Constitutional:  Alert and oriented, No acute distress. HEENT: [place] AT, moist mucus membranes.  Trachea midline, no masses. Cardiovascular: No clubbing, cyanosis, or edema. Respiratory: Normal respiratory effort,  no increased work of breathing. GI: Abdomen is soft, nontender, nondistended, no abdominal masses GU: No CVA tenderness.  Lymph: No cervical or inguinal lymphadenopathy. Skin: No rashes, bruises or suspicious lesions. Neurologic: Grossly intact, no focal deficits, moving all 4 extremities. Psychiatric: Normal mood and affect.  Laboratory Data: No results found for: "WBC", "HGB", "HCT", "MCV", "PLT"  No results found for: "CREATININE"  No results found for: "PSA"  No results found for: "TESTOSTERONE"  No results found for: "HGBA1C"  Urinalysis    Component Value Date/Time   APPEARANCEUR Clear 09/29/2021 1443   GLUCOSEU Negative 09/29/2021 1443   BILIRUBINUR Negative 09/29/2021 1443   PROTEINUR Negative 09/29/2021 1443   NITRITE Negative 09/29/2021 1443   LEUKOCYTESUR Trace (A) 09/29/2021 1443    Lab Results  Component Value Date   LABMICR See below: 09/29/2021   WBCUA 0-5 09/29/2021   LABEPIT 0-10 09/29/2021   BACTERIA Few 09/29/2021    Pertinent Imaging: *** Results for orders placed during the hospital encounter of 09/28/21  Abdomen 1 view (KUB)  Narrative CLINICAL DATA:  Nephrolithiasis.  EXAM: ABDOMEN - 1 VIEW  COMPARISON:  CT 06/24/2021  FINDINGS: Three adjacent stones project over the lower left kidney largest measuring 7 mm. The punctate right intrarenal calculi are not well seen on the current exam, obscured by overlying stool. No definite  ureteral stones. Previous small bladder stone may persist, although there are multiple pelvic phleboliths which partially obscures assessment for bladder calculi. Large volume of stool in the colon, no bowel obstruction.  IMPRESSION: 1. Three adjacent stones project over the lower left kidney largest measuring 7 mm. 2. Known right renal calculi are not well seen by radiograph. 3. Previous small bladder stone may persist, although there are multiple pelvic phleboliths which partially obscures assessment  for bladder calculi.   Electronically Signed By: Keith Rake M.D. On: 09/28/2021 15:38  Results for orders placed during the hospital encounter of 11/14/07  US VenoUS Imaging Bilateral  Narrative Clinical data: Symptomatic bilateral leg varicose veins, now 6 months status post transcatheter laser occlusion of the   greater saphenous vein.  BILATERAL LOWER EXTREMITY VENOUS DOPPLER ULTRASOUND  Technique: Gray-scale sonography with compression as well as color and duplex Doppler ultrasound were performed to evaluate   the saphenous vein and the deep venous system from the level of the common femoral vein through the popliteal and proximal calf veins.  Findings: The lower extremity deep venous systems demonstrate normal compressibility, phasicity, and augmentation. Posterior tibial veins unremarkable. No evidence of DVT.  The greater saphenous veins are occluded throughout the treatment segment. Associated varicose veins in the thigh and calf region are decompressed.  IMPRESSION 1. Technically successful occlusion of bilateral greater saphenous veins along the treatment length without apparent complication. 2. Normal deep venous systems. No DVT.  Provider: Reece Levy, Lovina Reach, D. Arne Cleveland  No results found for this or any previous visit.  No results found for this or any previous visit.  No results found for this or any previous visit.  No valid procedures specified. No results found for this or any previous visit.  Results for orders placed in visit on 06/24/21  CT RENAL STONE STUDY  Narrative CLINICAL DATA:  Left flank pain for 1 month. History of kidney stones.  EXAM: CT ABDOMEN AND PELVIS WITHOUT CONTRAST  TECHNIQUE: Multidetector CT imaging of the abdomen and pelvis was performed following the standard protocol without IV contrast.  RADIATION DOSE REDUCTION: This exam was performed according to the departmental dose-optimization  program which includes automated exposure control, adjustment of the mA and/or kV according to patient size and/or use of iterative reconstruction technique.  COMPARISON:  04/29/2014.  FINDINGS: Lower chest: Scattered volume loss in the lung bases. Heart is at the upper limits of normal in size. Atherosclerotic calcification of the aorta, aortic valve and coronary arteries. No pericardial or pleural effusion. Distal esophagus is grossly unremarkable.  Hepatobiliary: Liver and gallbladder are unremarkable. No biliary ductal dilatation.  Pancreas: Low-attenuation lesions off the proximal body and tail the pancreas measure 1.6 x 1.7 cm (2/21) and 0.8 x 1.0 cm (2/23), respectively. The larger lesion off the body of the pancreas is slightly larger than on 04/29/2014, at which time it measured 9 x 12 mm. The tail lesion appears new. Otherwise unremarkable.  Spleen: Negative.  Adrenals/Urinary Tract: Adrenal glands are unremarkable. Stones in the kidneys bilaterally. No ureteral stones or obstruction. 3 mm stone is seen dependently in the inferior bladder (2/75).  Stomach/Bowel: Small hiatal hernia. Stomach and small bowel are otherwise unremarkable. Appendix is minimally prominent but contains air and has no Peri appendiceal inflammatory stranding. Fair amount of stool in the colon is indicative of constipation.  Vascular/Lymphatic: Atherosclerotic calcification of the aorta. No pathologically enlarged lymph nodes.  Reproductive: Uterus is visualized.  No adnexal mass.  Other: Trace pelvic  free fluid. Mesenteries and peritoneum are otherwise unremarkable.  Musculoskeletal: Degenerative changes in the spine. No worrisome lytic or sclerotic lesions.  IMPRESSION: 1. Bilateral renal stones. 3 mm stone in the bladder, possibly recently passed. No hydronephrosis. 2. Low-attenuation lesions off the body, new or enlarged from 2016. Benign lesions are favored. Malignancy cannot be  excluded. Follow-up CT abdomen without and with contrast in 1 year is recommended, as clinically indicated. This recommendation follows ACR consensus guidelines: Management of Incidental Pancreatic Cysts: A White Paper of the ACR Incidental Findings Committee. J Am Coll Radiol [id number]. 3. Aortic atherosclerosis (ICD10-I70.0). Coronary artery calcification.   Electronically Signed By: Lorin Picket M.D. On: 06/24/2021 11:50   Assessment & Plan:    1. Nephrolithiasis STAT CT stone study - Urinalysis, Routine w reflex microscopic   No follow-ups on file.  Nicolette Bang, MD  [facility] [place]

## 2022-04-19 ENCOUNTER — Telehealth: Payer: Self-pay

## 2022-04-19 NOTE — Telephone Encounter (Signed)
Return called to patient. Patient voiced that MD made her aware in clinic that she had blood in her urine. Patient states that she was made aware that there no stones on her CT.  Per Dr. Alyson Ingles her CT showed no new stones and the presumed calculus on the KUB was not a true stone. Patient should f/u in 6 months. Patient states she would like to know what was showed on the CT if it was not a stone and if she has any infection because there was WBC in her urine. Made patient aware that I will send a task to the MD and he will be out of the office for a few days and someone will reach back out to her, once MD response. Patient voiced understanding

## 2022-04-19 NOTE — Telephone Encounter (Signed)
Return to call to patient. Patient is aware that once MD review CT someone will be in touch. Patient voiced understanding

## 2022-04-19 NOTE — Telephone Encounter (Signed)
Open in error

## 2022-04-19 NOTE — Telephone Encounter (Signed)
Patient called advising she had questions regarding if the CT showed if she needed to proceed with lithotripsy. Patient wanted to know if you could call her with the results of her scan from Friday.

## 2022-04-20 ENCOUNTER — Encounter: Payer: Self-pay | Admitting: Urology

## 2022-04-20 NOTE — Patient Instructions (Signed)

## 2022-04-21 NOTE — Telephone Encounter (Signed)
Patient is aware that the WBC in the urine is inflammation, there a small stone on the left and right. nothing concerning, per Dr. Alyson Ingles. Patient voiced understanding

## 2022-05-18 DIAGNOSIS — C4401 Basal cell carcinoma of skin of lip: Secondary | ICD-10-CM | POA: Diagnosis not present

## 2022-06-22 DIAGNOSIS — G518 Other disorders of facial nerve: Secondary | ICD-10-CM | POA: Diagnosis not present

## 2022-06-22 DIAGNOSIS — M542 Cervicalgia: Secondary | ICD-10-CM | POA: Diagnosis not present

## 2022-06-22 DIAGNOSIS — M791 Myalgia, unspecified site: Secondary | ICD-10-CM | POA: Diagnosis not present

## 2022-06-22 DIAGNOSIS — G43719 Chronic migraine without aura, intractable, without status migrainosus: Secondary | ICD-10-CM | POA: Diagnosis not present

## 2022-07-28 DIAGNOSIS — N390 Urinary tract infection, site not specified: Secondary | ICD-10-CM | POA: Diagnosis not present

## 2022-07-28 DIAGNOSIS — Z299 Encounter for prophylactic measures, unspecified: Secondary | ICD-10-CM | POA: Diagnosis not present

## 2022-07-28 DIAGNOSIS — E1165 Type 2 diabetes mellitus with hyperglycemia: Secondary | ICD-10-CM | POA: Diagnosis not present

## 2022-07-28 DIAGNOSIS — R300 Dysuria: Secondary | ICD-10-CM | POA: Diagnosis not present

## 2022-09-21 DIAGNOSIS — M791 Myalgia, unspecified site: Secondary | ICD-10-CM | POA: Diagnosis not present

## 2022-09-21 DIAGNOSIS — G518 Other disorders of facial nerve: Secondary | ICD-10-CM | POA: Diagnosis not present

## 2022-09-21 DIAGNOSIS — M542 Cervicalgia: Secondary | ICD-10-CM | POA: Diagnosis not present

## 2022-09-21 DIAGNOSIS — G43719 Chronic migraine without aura, intractable, without status migrainosus: Secondary | ICD-10-CM | POA: Diagnosis not present

## 2022-09-23 DIAGNOSIS — Z1339 Encounter for screening examination for other mental health and behavioral disorders: Secondary | ICD-10-CM | POA: Diagnosis not present

## 2022-09-23 DIAGNOSIS — Z1331 Encounter for screening for depression: Secondary | ICD-10-CM | POA: Diagnosis not present

## 2022-09-23 DIAGNOSIS — R5383 Other fatigue: Secondary | ICD-10-CM | POA: Diagnosis not present

## 2022-09-23 DIAGNOSIS — E7800 Pure hypercholesterolemia, unspecified: Secondary | ICD-10-CM | POA: Diagnosis not present

## 2022-09-23 DIAGNOSIS — Z79899 Other long term (current) drug therapy: Secondary | ICD-10-CM | POA: Diagnosis not present

## 2022-09-23 DIAGNOSIS — Z0000 Encounter for general adult medical examination without abnormal findings: Secondary | ICD-10-CM | POA: Diagnosis not present

## 2022-09-23 DIAGNOSIS — Z299 Encounter for prophylactic measures, unspecified: Secondary | ICD-10-CM | POA: Diagnosis not present

## 2022-09-23 DIAGNOSIS — Z7189 Other specified counseling: Secondary | ICD-10-CM | POA: Diagnosis not present

## 2022-10-18 ENCOUNTER — Telehealth: Payer: Self-pay

## 2022-10-18 DIAGNOSIS — N200 Calculus of kidney: Secondary | ICD-10-CM

## 2022-10-18 NOTE — Telephone Encounter (Signed)
Patient called to confirm if xray needed prior to Friday visit. Confirmed with patient she will get KUB prior. Order placed.

## 2022-10-21 DIAGNOSIS — Z6823 Body mass index (BMI) 23.0-23.9, adult: Secondary | ICD-10-CM | POA: Diagnosis not present

## 2022-10-21 DIAGNOSIS — F3342 Major depressive disorder, recurrent, in full remission: Secondary | ICD-10-CM | POA: Diagnosis not present

## 2022-10-21 DIAGNOSIS — E1165 Type 2 diabetes mellitus with hyperglycemia: Secondary | ICD-10-CM | POA: Diagnosis not present

## 2022-10-21 DIAGNOSIS — Z0000 Encounter for general adult medical examination without abnormal findings: Secondary | ICD-10-CM | POA: Diagnosis not present

## 2022-10-21 DIAGNOSIS — Z299 Encounter for prophylactic measures, unspecified: Secondary | ICD-10-CM | POA: Diagnosis not present

## 2022-10-22 ENCOUNTER — Ambulatory Visit: Payer: Medicare PPO | Admitting: Urology

## 2022-10-22 ENCOUNTER — Ambulatory Visit (HOSPITAL_COMMUNITY)
Admission: RE | Admit: 2022-10-22 | Discharge: 2022-10-22 | Disposition: A | Discharge status: Home / Self Care | Payer: Medicare PPO | Source: Ambulatory Visit | Attending: Urology | Admitting: Urology

## 2022-10-22 VITALS — BP 105/65 | HR 74

## 2022-10-22 DIAGNOSIS — N200 Calculus of kidney: Secondary | ICD-10-CM

## 2022-10-22 LAB — URINALYSIS, ROUTINE W REFLEX MICROSCOPIC
Bilirubin, UA: NEGATIVE
Glucose, UA: NEGATIVE
Ketones, UA: NEGATIVE
Leukocytes,UA: NEGATIVE
Nitrite, UA: NEGATIVE
Protein,UA: NEGATIVE
RBC, UA: NEGATIVE
Specific Gravity, UA: 1.015 (ref 1.005–1.030)
Urobilinogen, Ur: 0.2 mg/dL (ref 0.2–1.0)
pH, UA: 7.5 (ref 5.0–7.5)

## 2022-10-22 NOTE — Progress Notes (Unsigned)
10/22/2022 11:20 AM   Erica Cain 11-Apr-1950 [id number]  Referring provider: Ignatius Specking, MD [address] [place],  [place] [address]  Followup nephrolithiasis   HPI: [name] is a 72yo here for followup for nephrolithiasis. KUB from today shows stable left lower pole calculus. She denies any flank pain. No stone events since last visit. No worsening LUTS.    PMH: No past medical history on file.  Surgical History: No past surgical history on file.  Home Medications:  Allergies as of 10/22/2022       Reactions   Aspirin Rash   Ceftin [cefuroxime Axetil]    Ciprofloxacin    Sulfamethoxazole-trimethoprim         Medication List        Accurate as of October 22, 2022 11:20 AM. If you have any questions, ask your nurse or doctor.          baclofen 10 MG tablet Commonly known as: LIORESAL Take by mouth.   fenoprofen 600 MG Tabs tablet Commonly known as: NALFON Take 600 mg by mouth.   FLUoxetine 20 MG capsule Commonly known as: PROZAC Take by mouth.   ipratropium 0.03 % nasal spray Commonly known as: ATROVENT Place 1 spray into both nostrils 2 (two) times daily.   levocetirizine 5 MG tablet Commonly known as: XYZAL Take by mouth.   rosuvastatin 5 MG tablet Commonly known as: CRESTOR Take by mouth.   topiramate 100 MG tablet Commonly known as: TOPAMAX Take by mouth.   Womens 50+ Multi Vitamin Tabs Take by mouth.        Allergies:  Allergies  Allergen Reactions   Aspirin Rash   Ceftin [Cefuroxime Axetil]    Ciprofloxacin    Sulfamethoxazole-Trimethoprim     Family History: No family history on file.  Social History:  has no history on file for tobacco use, alcohol use, and drug use.  ROS: All other review of systems were reviewed and are negative except what is noted above in HPI  Physical Exam: BP 105/65   Pulse 74   Constitutional:  Alert and oriented, No acute distress. HEENT: [place] AT, moist mucus membranes.  Trachea midline,  no masses. Cardiovascular: No clubbing, cyanosis, or edema. Respiratory: Normal respiratory effort, no increased work of breathing. GI: Abdomen is soft, nontender, nondistended, no abdominal masses GU: No CVA tenderness.  Lymph: No cervical or inguinal lymphadenopathy. Skin: No rashes, bruises or suspicious lesions. Neurologic: Grossly intact, no focal deficits, moving all 4 extremities. Psychiatric: Normal mood and affect.  Laboratory Data: No results found for: "WBC", "HGB", "HCT", "MCV", "PLT"  No results found for: "CREATININE"  No results found for: "PSA"  No results found for: "TESTOSTERONE"  No results found for: "HGBA1C"  Urinalysis    Component Value Date/Time   APPEARANCEUR Clear 04/16/2022 1011   GLUCOSEU Negative 04/16/2022 1011   BILIRUBINUR Negative 04/16/2022 1011   PROTEINUR Negative 04/16/2022 1011   NITRITE Negative 04/16/2022 1011   LEUKOCYTESUR Trace (A) 04/16/2022 1011    Lab Results  Component Value Date   LABMICR See below: 04/16/2022   WBCUA 6-10 (A) 04/16/2022   LABEPIT 0-10 04/16/2022   BACTERIA None seen 04/16/2022    Pertinent Imaging: KUB today: Images reviewed and discussed with the patient  Results for orders placed during the hospital encounter of 04/14/22  Abdomen 1 view (KUB)  Narrative CLINICAL DATA:  Nephrolithiasis  EXAM: ABDOMEN - 1 VIEW  COMPARISON:  09/28/2021  FINDINGS: At least 2 calcifications project over  the lower pole left renal collecting system, the larger 8 mm. No definite right urolithiasis although there is moderate fecal material projecting over both kidneys which limits sensitivity. Stomach and small bowel are decompressed. Bilateral pelvic phleboliths. Mild multilevel spondylitic change in the lumbar spine.  IMPRESSION: Left nephrolithiasis.   Electronically Signed By: Corlis Leak M.D. On: 04/17/2022 10:24  Results for orders placed during the hospital encounter of 11/14/07  US VenoUS Imaging  Bilateral  Narrative Clinical data: Symptomatic bilateral leg varicose veins, now 6 months status post transcatheter laser occlusion of the   greater saphenous vein.  BILATERAL LOWER EXTREMITY VENOUS DOPPLER ULTRASOUND  Technique: Gray-scale sonography with compression as well as color and duplex Doppler ultrasound were performed to evaluate   the saphenous vein and the deep venous system from the level of the common femoral vein through the popliteal and proximal calf veins.  Findings: The lower extremity deep venous systems demonstrate normal compressibility, phasicity, and augmentation. Posterior tibial veins unremarkable. No evidence of DVT.  The greater saphenous veins are occluded throughout the treatment segment. Associated varicose veins in the thigh and calf region are decompressed.  IMPRESSION 1. Technically successful occlusion of bilateral greater saphenous veins along the treatment length without apparent complication. 2. Normal deep venous systems. No DVT.  Provider: Amado Nash, Talbert Forest, D. Oley Balm  No results found for this or any previous visit.  No results found for this or any previous visit.  No results found for this or any previous visit.  No valid procedures specified. No results found for this or any previous visit.  Results for orders placed in visit on 04/16/22  CT RENAL STONE STUDY  Narrative CLINICAL DATA:  Hematuria, history of renal stones  EXAM: CT ABDOMEN AND PELVIS WITHOUT CONTRAST  TECHNIQUE: Multidetector CT imaging of the abdomen and pelvis was performed following the standard protocol without IV contrast.  RADIATION DOSE REDUCTION: This exam was performed according to the departmental dose-optimization program which includes automated exposure control, adjustment of the mA and/or kV according to patient size and/or use of iterative reconstruction technique.  COMPARISON:  CT abdomen and pelvis dated June 24, 2021  FINDINGS: Lower chest: No acute abnormality.  Hepatobiliary: No focal liver abnormality is seen. No gallstones, gallbladder wall thickening, or biliary dilatation.  Pancreas: Cystic lesion of the body of the pancreas measuring 1.7 cm on series 2, image 18, unchanged when compared with the prior exam. No evidence of main pancreatic duct dilation.  Spleen: Normal in size without focal abnormality.  Adrenals/Urinary Tract: Bilateral adrenal glands are unremarkable. No hydronephrosis. Bilateral nonobstructing renal stones, unchanged when compared with the prior exam. Largest is a 8 mm stone of the lower pole of the left kidney. 3 mm bladder stone.  Stomach/Bowel: Stomach is within normal limits. No evidence of bowel wall thickening, distention, or inflammatory changes.  Vascular/Lymphatic: Aortic atherosclerosis. No enlarged abdominal or pelvic lymph nodes.  Reproductive: Uterus and bilateral adnexa are unremarkable.  Other: No abdominal wall hernia or abnormality. No abdominopelvic ascites.  Musculoskeletal: No acute or significant osseous findings.  IMPRESSION: 1. No acute findings in the abdomen or pelvis. 2. Bilateral nonobstructing renal stones and 3 mm bladder stone, unchanged when compared with prior. 3. 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 Recommend follow up pre and post contrast MRI/MRCP or pancreatic protocol CT in 1 years. 4. Aortic Atherosclerosis (ICD10-I70.0).   Electronically Signed By: Allegra Lai  M.D. On: 04/16/2022 11:49   Assessment & Plan:    1. Nephrolithiasis -dietary handout given Followup 1 year with KUB - Urinalysis, Routine w reflex microscopic   No follow-ups on file.  Wilkie Aye, MD  [facility] [place]

## 2022-10-25 DIAGNOSIS — H43811 Vitreous degeneration, right eye: Secondary | ICD-10-CM | POA: Diagnosis not present

## 2022-10-27 ENCOUNTER — Encounter: Payer: Self-pay | Admitting: Urology

## 2022-10-27 NOTE — Patient Instructions (Signed)

## 2022-11-03 DIAGNOSIS — R5383 Other fatigue: Secondary | ICD-10-CM | POA: Diagnosis not present

## 2022-11-03 DIAGNOSIS — R197 Diarrhea, unspecified: Secondary | ICD-10-CM | POA: Diagnosis not present

## 2022-11-03 DIAGNOSIS — Z6822 Body mass index (BMI) 22.0-22.9, adult: Secondary | ICD-10-CM | POA: Diagnosis not present

## 2022-11-03 DIAGNOSIS — Z299 Encounter for prophylactic measures, unspecified: Secondary | ICD-10-CM | POA: Diagnosis not present

## 2022-11-03 DIAGNOSIS — K2970 Gastritis, unspecified, without bleeding: Secondary | ICD-10-CM | POA: Diagnosis not present

## 2022-11-30 DIAGNOSIS — N390 Urinary tract infection, site not specified: Secondary | ICD-10-CM | POA: Diagnosis not present

## 2022-11-30 DIAGNOSIS — Z299 Encounter for prophylactic measures, unspecified: Secondary | ICD-10-CM | POA: Diagnosis not present

## 2022-11-30 DIAGNOSIS — R350 Frequency of micturition: Secondary | ICD-10-CM | POA: Diagnosis not present

## 2022-12-21 DIAGNOSIS — M791 Myalgia, unspecified site: Secondary | ICD-10-CM | POA: Diagnosis not present

## 2022-12-21 DIAGNOSIS — M542 Cervicalgia: Secondary | ICD-10-CM | POA: Diagnosis not present

## 2022-12-21 DIAGNOSIS — G518 Other disorders of facial nerve: Secondary | ICD-10-CM | POA: Diagnosis not present

## 2022-12-21 DIAGNOSIS — G43719 Chronic migraine without aura, intractable, without status migrainosus: Secondary | ICD-10-CM | POA: Diagnosis not present

## 2023-01-07 DIAGNOSIS — Z1231 Encounter for screening mammogram for malignant neoplasm of breast: Secondary | ICD-10-CM | POA: Diagnosis not present

## 2023-01-26 DIAGNOSIS — Z299 Encounter for prophylactic measures, unspecified: Secondary | ICD-10-CM | POA: Diagnosis not present

## 2023-01-26 DIAGNOSIS — E1169 Type 2 diabetes mellitus with other specified complication: Secondary | ICD-10-CM | POA: Diagnosis not present

## 2023-01-26 DIAGNOSIS — Z6823 Body mass index (BMI) 23.0-23.9, adult: Secondary | ICD-10-CM | POA: Diagnosis not present

## 2023-01-26 DIAGNOSIS — J3489 Other specified disorders of nose and nasal sinuses: Secondary | ICD-10-CM | POA: Diagnosis not present

## 2023-03-28 DIAGNOSIS — G43719 Chronic migraine without aura, intractable, without status migrainosus: Secondary | ICD-10-CM | POA: Diagnosis not present

## 2023-03-28 DIAGNOSIS — M542 Cervicalgia: Secondary | ICD-10-CM | POA: Diagnosis not present

## 2023-03-28 DIAGNOSIS — G518 Other disorders of facial nerve: Secondary | ICD-10-CM | POA: Diagnosis not present

## 2023-03-28 DIAGNOSIS — M791 Myalgia, unspecified site: Secondary | ICD-10-CM | POA: Diagnosis not present

## 2023-03-29 DIAGNOSIS — E1165 Type 2 diabetes mellitus with hyperglycemia: Secondary | ICD-10-CM | POA: Diagnosis not present

## 2023-03-29 DIAGNOSIS — Z299 Encounter for prophylactic measures, unspecified: Secondary | ICD-10-CM | POA: Diagnosis not present

## 2023-03-29 DIAGNOSIS — M545 Low back pain, unspecified: Secondary | ICD-10-CM | POA: Diagnosis not present

## 2023-05-23 DIAGNOSIS — F3342 Major depressive disorder, recurrent, in full remission: Secondary | ICD-10-CM | POA: Diagnosis not present

## 2023-05-23 DIAGNOSIS — Z299 Encounter for prophylactic measures, unspecified: Secondary | ICD-10-CM | POA: Diagnosis not present

## 2023-05-23 DIAGNOSIS — M5416 Radiculopathy, lumbar region: Secondary | ICD-10-CM | POA: Diagnosis not present

## 2023-05-23 DIAGNOSIS — E1169 Type 2 diabetes mellitus with other specified complication: Secondary | ICD-10-CM | POA: Diagnosis not present

## 2023-05-24 DIAGNOSIS — M4186 Other forms of scoliosis, lumbar region: Secondary | ICD-10-CM | POA: Diagnosis not present

## 2023-05-24 DIAGNOSIS — M4807 Spinal stenosis, lumbosacral region: Secondary | ICD-10-CM | POA: Diagnosis not present

## 2023-05-24 DIAGNOSIS — M47816 Spondylosis without myelopathy or radiculopathy, lumbar region: Secondary | ICD-10-CM | POA: Diagnosis not present

## 2023-05-24 DIAGNOSIS — M5126 Other intervertebral disc displacement, lumbar region: Secondary | ICD-10-CM | POA: Diagnosis not present

## 2023-05-24 DIAGNOSIS — M5136 Other intervertebral disc degeneration, lumbar region with discogenic back pain only: Secondary | ICD-10-CM | POA: Diagnosis not present

## 2023-06-14 DIAGNOSIS — Z299 Encounter for prophylactic measures, unspecified: Secondary | ICD-10-CM | POA: Diagnosis not present

## 2023-06-14 DIAGNOSIS — E1169 Type 2 diabetes mellitus with other specified complication: Secondary | ICD-10-CM | POA: Diagnosis not present

## 2023-06-14 DIAGNOSIS — M545 Low back pain, unspecified: Secondary | ICD-10-CM | POA: Diagnosis not present

## 2023-06-14 DIAGNOSIS — F3342 Major depressive disorder, recurrent, in full remission: Secondary | ICD-10-CM | POA: Diagnosis not present

## 2023-06-28 DIAGNOSIS — M791 Myalgia, unspecified site: Secondary | ICD-10-CM | POA: Diagnosis not present

## 2023-06-28 DIAGNOSIS — G518 Other disorders of facial nerve: Secondary | ICD-10-CM | POA: Diagnosis not present

## 2023-06-28 DIAGNOSIS — G43719 Chronic migraine without aura, intractable, without status migrainosus: Secondary | ICD-10-CM | POA: Diagnosis not present

## 2023-06-28 DIAGNOSIS — M542 Cervicalgia: Secondary | ICD-10-CM | POA: Diagnosis not present

## 2023-07-04 DIAGNOSIS — M5416 Radiculopathy, lumbar region: Secondary | ICD-10-CM | POA: Diagnosis not present

## 2023-07-19 DIAGNOSIS — M5416 Radiculopathy, lumbar region: Secondary | ICD-10-CM | POA: Diagnosis not present

## 2023-07-27 DIAGNOSIS — E1165 Type 2 diabetes mellitus with hyperglycemia: Secondary | ICD-10-CM | POA: Diagnosis not present

## 2023-07-27 DIAGNOSIS — E1169 Type 2 diabetes mellitus with other specified complication: Secondary | ICD-10-CM | POA: Diagnosis not present

## 2023-07-27 DIAGNOSIS — Z299 Encounter for prophylactic measures, unspecified: Secondary | ICD-10-CM | POA: Diagnosis not present

## 2023-07-27 DIAGNOSIS — F3342 Major depressive disorder, recurrent, in full remission: Secondary | ICD-10-CM | POA: Diagnosis not present

## 2023-08-23 DIAGNOSIS — M79671 Pain in right foot: Secondary | ICD-10-CM | POA: Diagnosis not present

## 2023-08-23 DIAGNOSIS — M7741 Metatarsalgia, right foot: Secondary | ICD-10-CM | POA: Diagnosis not present

## 2023-09-20 DIAGNOSIS — G43719 Chronic migraine without aura, intractable, without status migrainosus: Secondary | ICD-10-CM | POA: Diagnosis not present

## 2023-09-20 DIAGNOSIS — M791 Myalgia, unspecified site: Secondary | ICD-10-CM | POA: Diagnosis not present

## 2023-09-20 DIAGNOSIS — G518 Other disorders of facial nerve: Secondary | ICD-10-CM | POA: Diagnosis not present

## 2023-09-20 DIAGNOSIS — M542 Cervicalgia: Secondary | ICD-10-CM | POA: Diagnosis not present

## 2023-10-06 DIAGNOSIS — Z7189 Other specified counseling: Secondary | ICD-10-CM | POA: Diagnosis not present

## 2023-10-06 DIAGNOSIS — Z1339 Encounter for screening examination for other mental health and behavioral disorders: Secondary | ICD-10-CM | POA: Diagnosis not present

## 2023-10-06 DIAGNOSIS — M8580 Other specified disorders of bone density and structure, unspecified site: Secondary | ICD-10-CM | POA: Diagnosis not present

## 2023-10-06 DIAGNOSIS — Z79899 Other long term (current) drug therapy: Secondary | ICD-10-CM | POA: Diagnosis not present

## 2023-10-06 DIAGNOSIS — Z1331 Encounter for screening for depression: Secondary | ICD-10-CM | POA: Diagnosis not present

## 2023-10-06 DIAGNOSIS — Z299 Encounter for prophylactic measures, unspecified: Secondary | ICD-10-CM | POA: Diagnosis not present

## 2023-10-06 DIAGNOSIS — R5383 Other fatigue: Secondary | ICD-10-CM | POA: Diagnosis not present

## 2023-10-06 DIAGNOSIS — Z0000 Encounter for general adult medical examination without abnormal findings: Secondary | ICD-10-CM | POA: Diagnosis not present

## 2023-10-06 DIAGNOSIS — E7800 Pure hypercholesterolemia, unspecified: Secondary | ICD-10-CM | POA: Diagnosis not present

## 2023-10-19 ENCOUNTER — Ambulatory Visit (HOSPITAL_COMMUNITY)
Admission: RE | Admit: 2023-10-19 | Discharge: 2023-10-19 | Disposition: A | Discharge status: Home / Self Care | Source: Ambulatory Visit | Attending: Urology | Admitting: Urology

## 2023-10-19 ENCOUNTER — Ambulatory Visit: Payer: Medicare PPO | Admitting: Urology

## 2023-10-19 ENCOUNTER — Encounter: Payer: Self-pay | Admitting: Urology

## 2023-10-19 VITALS — BP 103/65 | HR 70

## 2023-10-19 DIAGNOSIS — N200 Calculus of kidney: Secondary | ICD-10-CM

## 2023-10-19 DIAGNOSIS — I878 Other specified disorders of veins: Secondary | ICD-10-CM | POA: Diagnosis not present

## 2023-10-19 LAB — URINALYSIS, ROUTINE W REFLEX MICROSCOPIC
Bilirubin, UA: NEGATIVE
Glucose, UA: NEGATIVE
Ketones, UA: NEGATIVE
Nitrite, UA: NEGATIVE
Protein,UA: NEGATIVE
RBC, UA: NEGATIVE
Specific Gravity, UA: 1.02 (ref 1.005–1.030)
Urobilinogen, Ur: 1 mg/dL (ref 0.2–1.0)
pH, UA: 6 (ref 5.0–7.5)

## 2023-10-19 LAB — MICROSCOPIC EXAMINATION

## 2023-10-19 NOTE — Patient Instructions (Signed)

## 2023-10-19 NOTE — Progress Notes (Signed)
 10/19/2023 10:42 AM   Erica Cain 04-Oct-1950 [id number]  Referring provider: Rosamond Leta NOVAK, MD [address] [place],  [place] [address]  No chief complaint on file.   HPI: Erica Cain is a 72yo here for followup for nephrolithiasis. No stone events since last visit. KUb shows stable left lower pole calculi. No flank pain. No significant LUTS.    PMH: No past medical history on file.  Surgical History: No past surgical history on file.  Home Medications:  Allergies as of 10/19/2023       Reactions   Aspirin Rash   Ceftin [cefuroxime Axetil]    Ciprofloxacin    Sulfamethoxazole-trimethoprim         Medication List        Accurate as of October 19, 2023 10:42 AM. If you have any questions, ask your nurse or doctor.          baclofen 10 MG tablet Commonly known as: LIORESAL Take by mouth.   fenoprofen 600 MG Tabs tablet Commonly known as: NALFON Take 600 mg by mouth.   FLUoxetine 20 MG capsule Commonly known as: PROZAC Take by mouth.   ipratropium 0.03 % nasal spray Commonly known as: ATROVENT Place 1 spray into both nostrils 2 (two) times daily.   levocetirizine 5 MG tablet Commonly known as: XYZAL Take by mouth.   rosuvastatin 5 MG tablet Commonly known as: CRESTOR Take by mouth.   topiramate 100 MG tablet Commonly known as: TOPAMAX Take by mouth.   Womens 50+ Multi Vitamin Tabs Take by mouth.        Allergies:  Allergies  Allergen Reactions   Aspirin Rash   Ceftin [Cefuroxime Axetil]    Ciprofloxacin    Sulfamethoxazole-Trimethoprim     Family History: No family history on file.  Social History:  has no history on file for tobacco use, alcohol  use, and drug use.  ROS: All other review of systems were reviewed and are negative except what is noted above in HPI  Physical Exam: BP 103/65   Pulse 70   Constitutional:  Alert and oriented, No acute distress. HEENT: [place] AT, moist mucus membranes.  Trachea midline, no  masses. Cardiovascular: No clubbing, cyanosis, or edema. Respiratory: Normal respiratory effort, no increased work of breathing. GI: Abdomen is soft, nontender, nondistended, no abdominal masses GU: No CVA tenderness.  Lymph: No cervical or inguinal lymphadenopathy. Skin: No rashes, bruises or suspicious lesions. Neurologic: Grossly intact, no focal deficits, moving all 4 extremities. Psychiatric: Normal mood and affect.  Laboratory Data: No results found for: WBC, HGB, HCT, MCV, PLT  No results found for: CREATININE  No results found for: PSA  No results found for: TESTOSTERONE  No results found for: HGBA1C  Urinalysis    Component Value Date/Time   APPEARANCEUR Clear 10/19/2023 0938   GLUCOSEU Negative 10/19/2023 0938   BILIRUBINUR Negative 10/19/2023 0938   PROTEINUR Negative 10/19/2023 0938   NITRITE Negative 10/19/2023 0938   LEUKOCYTESUR 1+ (A) 10/19/2023 0938    Lab Results  Component Value Date   LABMICR See below: 10/19/2023   WBCUA 11-30 (A) 10/19/2023   LABEPIT 0-10 10/19/2023   BACTERIA Few (A) 10/19/2023    Pertinent Imaging: KUb today: Images reviewed and discussed with the patient  Results for orders placed in visit on 10/18/22  DG Abd 1 View  Narrative CLINICAL DATA:  Nephrolithiasis.  EXAM: ABDOMEN - 1 VIEW  COMPARISON:  04/14/2022  FINDINGS: Two calculi again seen in the lower pole of  the left kidney measuring 6 and 8 mm.  No dilated loops of bowel to indicate ileus or obstruction. Moderate colonic stool burden.  Moderate degenerative changes of the lumbar spine.  IMPRESSION: Unchanged 6 and 8 mm left renal calculi.   Electronically Signed By: Aliene Lloyd M.D. On: 10/28/2022 19:03  Results for orders placed during the hospital encounter of 11/14/07  US  VenoUS Imaging Bilateral  Narrative Clinical data: Symptomatic bilateral leg varicose veins, now 6 months status post transcatheter laser occlusion of the    greater saphenous vein.  BILATERAL LOWER EXTREMITY VENOUS DOPPLER ULTRASOUND  Technique: Gray-scale sonography with compression as well as color and duplex Doppler ultrasound were performed to evaluate   the saphenous vein and the deep venous system from the level of the common femoral vein through the popliteal and proximal calf veins.  Findings: The lower extremity deep venous systems demonstrate normal compressibility, phasicity, and augmentation. Posterior tibial veins unremarkable. No evidence of DVT.  The greater saphenous veins are occluded throughout the treatment segment. Associated varicose veins in the thigh and calf region are decompressed.  IMPRESSION 1. Technically successful occlusion of bilateral greater saphenous veins along the treatment length without apparent complication. 2. Normal deep venous systems. No DVT.  Provider: Fernand Na, Joetta Blacker, D. Toribio Faes  No results found for this or any previous visit.  No results found for this or any previous visit.  No results found for this or any previous visit.  No results found for this or any previous visit.  No results found for this or any previous visit.  Results for orders placed in visit on 04/16/22  CT RENAL STONE STUDY  Narrative CLINICAL DATA:  Hematuria, history of renal stones  EXAM: CT ABDOMEN AND PELVIS WITHOUT CONTRAST  TECHNIQUE: Multidetector CT imaging of the abdomen and pelvis was performed following the standard protocol without IV contrast.  RADIATION DOSE REDUCTION: This exam was performed according to the departmental dose-optimization program which includes automated exposure control, adjustment of the mA and/or kV according to patient size and/or use of iterative reconstruction technique.  COMPARISON:  CT abdomen and pelvis dated June 24, 2021  FINDINGS: Lower chest: No acute abnormality.  Hepatobiliary: No focal liver abnormality is seen. No  gallstones, gallbladder wall thickening, or biliary dilatation.  Pancreas: Cystic lesion of the body of the pancreas measuring 1.7 cm on series 2, image 18, unchanged when compared with the prior exam. No evidence of main pancreatic duct dilation.  Spleen: Normal in size without focal abnormality.  Adrenals/Urinary Tract: Bilateral adrenal glands are unremarkable. No hydronephrosis. Bilateral nonobstructing renal stones, unchanged when compared with the prior exam. Largest is a 8 mm stone of the lower pole of the left kidney. 3 mm bladder stone.  Stomach/Bowel: Stomach is within normal limits. No evidence of bowel wall thickening, distention, or inflammatory changes.  Vascular/Lymphatic: Aortic atherosclerosis. No enlarged abdominal or pelvic lymph nodes.  Reproductive: Uterus and bilateral adnexa are unremarkable.  Other: No abdominal wall hernia or abnormality. No abdominopelvic ascites.  Musculoskeletal: No acute or significant osseous findings.  IMPRESSION: 1. No acute findings in the abdomen or pelvis. 2. Bilateral nonobstructing renal stones and 3 mm bladder stone, unchanged when compared with prior. 3. 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 Recommend follow up pre and post contrast MRI/MRCP or pancreatic protocol CT in 1 years. 4. Aortic Atherosclerosis (ICD10-I70.0).   Electronically Signed By: Rea Marc M.D. On:  04/16/2022 11:49   Assessment & Plan:    1. Nephrolithiasis (Primary) Followup 1 year with KUB - Urinalysis, Routine w reflex microscopic   No follow-ups on file.  Belvie Clara, MD  [facility] [place]

## 2023-10-24 DIAGNOSIS — M5416 Radiculopathy, lumbar region: Secondary | ICD-10-CM | POA: Diagnosis not present

## 2023-10-26 DIAGNOSIS — R52 Pain, unspecified: Secondary | ICD-10-CM | POA: Diagnosis not present

## 2023-10-26 DIAGNOSIS — E1169 Type 2 diabetes mellitus with other specified complication: Secondary | ICD-10-CM | POA: Diagnosis not present

## 2023-10-26 DIAGNOSIS — Z0000 Encounter for general adult medical examination without abnormal findings: Secondary | ICD-10-CM | POA: Diagnosis not present

## 2023-10-26 DIAGNOSIS — Z299 Encounter for prophylactic measures, unspecified: Secondary | ICD-10-CM | POA: Diagnosis not present

## 2023-10-27 DIAGNOSIS — H43811 Vitreous degeneration, right eye: Secondary | ICD-10-CM | POA: Diagnosis not present

## 2023-11-14 DIAGNOSIS — M5416 Radiculopathy, lumbar region: Secondary | ICD-10-CM | POA: Diagnosis not present

## 2023-11-22 DIAGNOSIS — E2839 Other primary ovarian failure: Secondary | ICD-10-CM | POA: Diagnosis not present

## 2023-11-30 DIAGNOSIS — D1801 Hemangioma of skin and subcutaneous tissue: Secondary | ICD-10-CM | POA: Diagnosis not present

## 2023-11-30 DIAGNOSIS — L821 Other seborrheic keratosis: Secondary | ICD-10-CM | POA: Diagnosis not present

## 2023-11-30 DIAGNOSIS — L720 Epidermal cyst: Secondary | ICD-10-CM | POA: Diagnosis not present

## 2023-11-30 DIAGNOSIS — D045 Carcinoma in situ of skin of trunk: Secondary | ICD-10-CM | POA: Diagnosis not present

## 2023-11-30 DIAGNOSIS — L570 Actinic keratosis: Secondary | ICD-10-CM | POA: Diagnosis not present

## 2023-11-30 DIAGNOSIS — L814 Other melanin hyperpigmentation: Secondary | ICD-10-CM | POA: Diagnosis not present

## 2023-12-08 DIAGNOSIS — M79672 Pain in left foot: Secondary | ICD-10-CM | POA: Diagnosis not present

## 2023-12-08 DIAGNOSIS — B070 Plantar wart: Secondary | ICD-10-CM | POA: Diagnosis not present

## 2023-12-21 DIAGNOSIS — G43719 Chronic migraine without aura, intractable, without status migrainosus: Secondary | ICD-10-CM | POA: Diagnosis not present

## 2023-12-21 DIAGNOSIS — M542 Cervicalgia: Secondary | ICD-10-CM | POA: Diagnosis not present

## 2023-12-21 DIAGNOSIS — G518 Other disorders of facial nerve: Secondary | ICD-10-CM | POA: Diagnosis not present

## 2023-12-21 DIAGNOSIS — M791 Myalgia, unspecified site: Secondary | ICD-10-CM | POA: Diagnosis not present

## 2023-12-29 DIAGNOSIS — M79672 Pain in left foot: Secondary | ICD-10-CM | POA: Diagnosis not present

## 2023-12-29 DIAGNOSIS — B070 Plantar wart: Secondary | ICD-10-CM | POA: Diagnosis not present

## 2024-01-13 DIAGNOSIS — Z1231 Encounter for screening mammogram for malignant neoplasm of breast: Secondary | ICD-10-CM | POA: Diagnosis not present

## 2024-10-19 ENCOUNTER — Ambulatory Visit: Admitting: Urology
